# Patient Record
Sex: Female | Born: 1980 | Race: Black or African American | Hispanic: No | Marital: Married | Smoking: Former smoker
Health system: Southern US, Community
[De-identification: ages and names within clinical notes are randomized; demographics above are authoritative.]

## PROBLEM LIST (undated history)

## (undated) DIAGNOSIS — IMO0002 Reserved for concepts with insufficient information to code with codable children: Secondary | ICD-10-CM

## (undated) DIAGNOSIS — T7840XA Allergy, unspecified, initial encounter: Secondary | ICD-10-CM

## (undated) DIAGNOSIS — R87619 Unspecified abnormal cytological findings in specimens from cervix uteri: Secondary | ICD-10-CM

## (undated) DIAGNOSIS — N390 Urinary tract infection, site not specified: Secondary | ICD-10-CM

## (undated) HISTORY — PX: KNEE ARTHROSCOPY: SHX127

## (undated) HISTORY — PX: CHOLECYSTECTOMY: SHX55

## (undated) HISTORY — PX: TUBAL LIGATION: SHX77

## (undated) HISTORY — DX: Allergy, unspecified, initial encounter: T78.40XA

---

## 2011-11-18 ENCOUNTER — Emergency Department (HOSPITAL_COMMUNITY)
Admission: EM | Admit: 2011-11-18 | Discharge: 2011-11-18 | Disposition: A | Discharge status: Home / Self Care | Payer: Self-pay | Attending: Emergency Medicine | Admitting: Emergency Medicine

## 2011-11-18 ENCOUNTER — Encounter (HOSPITAL_COMMUNITY): Payer: Self-pay | Admitting: Emergency Medicine

## 2011-11-18 DIAGNOSIS — G44209 Tension-type headache, unspecified, not intractable: Secondary | ICD-10-CM | POA: Insufficient documentation

## 2011-11-18 DIAGNOSIS — Z886 Allergy status to analgesic agent status: Secondary | ICD-10-CM | POA: Insufficient documentation

## 2011-11-18 MED ORDER — TIZANIDINE HCL 4 MG PO TABS: 4.0000 mg | ORAL_TABLET | Freq: Three times a day (TID) | ORAL | Status: AC | PRN

## 2011-11-18 MED ORDER — BUTALBITAL-APAP-CAFFEINE 50-325-40 MG PO TABS
2.0000 | ORAL_TABLET | Freq: Once | ORAL | Status: AC
  Administered 2011-11-18: 2 via ORAL
  Filled 2011-11-18: qty 2

## 2011-11-18 MED ORDER — BUTALBITAL-APAP-CAFFEINE 50-325-40 MG PO TABS: 1.0000 | ORAL_TABLET | ORAL | Status: AC | PRN

## 2011-11-18 NOTE — ED Notes (Signed)
Onset of headache yesterday afternoon, has Rx'ed w/ multiple OTCs w/o relief. Frontal/temporal areas.

## 2011-11-18 NOTE — ED Provider Notes (Signed)
History     CSN: [id number]  Arrival date & time 11/18/11  1207   First MD Initiated Contact with Patient 11/18/11 1249      Chief Complaint  Patient presents with  . Headache    (Consider location/radiation/quality/duration/timing/severity/associated sxs/prior treatment) Patient is a 31 y.o. female presenting with headaches. The history is provided by the patient.  Headache  This is a new problem. The current episode started yesterday. The problem occurs constantly. Progression since onset: gradually worsened at onset, but has stabilized at moderate intensity. The headache is associated with nothing. Pain location: like a band over frontal and temporal regions. Quality: aching. The pain is moderate. The pain does not radiate. Associated symptoms include nausea. Pertinent negatives include no anorexia, no fever, no malaise/fatigue, no near-syncope, no palpitations, no syncope, no shortness of breath and no vomiting. She has tried acetaminophen for the symptoms. The treatment provided no relief.    History reviewed. No pertinent past medical history.  Past Surgical History  Procedure Date  . Cholecystectomy   . Tubal ligation     No family history on file.  History  Substance Use Topics  . Smoking status: Former Games developer  . Smokeless tobacco: Never Used  . Alcohol Use:      socially     Review of Systems  Constitutional: Negative for fever and malaise/fatigue.  Respiratory: Negative for shortness of breath.   Cardiovascular: Negative for palpitations, syncope and near-syncope.  Gastrointestinal: Positive for nausea. Negative for vomiting and anorexia.  Neurological: Positive for headaches.  All other systems reviewed and are negative.    Allergies  Aspirin and Other  Home Medications   Current Outpatient Rx  Name Route Sig Dispense Refill  . ACETAMINOPHEN 325 MG PO TABS Oral Take 650 mg by mouth every 6 (six) hours as needed. For headache    . TRAMADOL HCL 50 MG  PO TABS Oral Take 50 mg by mouth See admin instructions. A friends medication      BP 105/58  Pulse 72  Temp 98.5 F (36.9 C) (Oral)  SpO2 99%  LMP 11/06/2011  Physical Exam  Constitutional: She is oriented to person, place, and time. She appears well-developed and well-nourished. No distress.       Vital signs are reviewed and are normal. She is afebrile.  HENT:  Head: Normocephalic and atraumatic.  Right Ear: External ear normal.  Left Ear: External ear normal.  Nose: Nose normal.  Mouth/Throat: No oropharyngeal exudate.       Enlarged tonsils without exudate bilaterally, chronic per pt  Eyes: Conjunctivae and EOM are normal. Pupils are equal, round, and reactive to light.       No nystagmus  Neck: Normal range of motion. Neck supple.  Cardiovascular: Normal rate, regular rhythm and normal heart sounds.   Pulmonary/Chest: Effort normal and breath sounds normal. No respiratory distress.  Abdominal: Soft. Bowel sounds are normal. She exhibits no distension. There is no tenderness.  Lymphadenopathy:    She has no cervical adenopathy.  Neurological: She is alert and oriented to person, place, and time. She has normal strength. No cranial nerve deficit (3-12 intact) or sensory deficit. She displays a negative Romberg sign. Coordination (F-N intact) and gait normal. GCS eye subscore is 4. GCS verbal subscore is 5. GCS motor subscore is 6.  Skin: Skin is warm and dry. No rash noted.  Psychiatric: She has a normal mood and affect.    ED Course  Procedures (including critical care time)  Labs Reviewed - No data to display No results found.   1. Tension headache       MDM  HA x 1 day. Similar prior HA usually improve with acetaminophen. Allergic to NSAIDs. Suspect primary HA, tension in nature. History and PE not c/w SDH, SAH/other ICH, meningitis, temporal arteritis, encephalitis. Given fioricet in ED with some relief. Pt felt ready for d/c home but did request further  medication for home use. Discussed tizanidine vs fioricet. She is given rx for both and will take as needed. Currently attempting to obtain a PCP for follow-up. Return precautions discussed.         Shaaron Adler, [place] 11/18/11 1503

## 2011-11-19 ENCOUNTER — Emergency Department (HOSPITAL_COMMUNITY)
Admission: EM | Admit: 2011-11-19 | Discharge: 2011-11-19 | Disposition: A | Discharge status: Home / Self Care | Payer: Self-pay | Attending: Emergency Medicine | Admitting: Emergency Medicine

## 2011-11-19 ENCOUNTER — Encounter (HOSPITAL_COMMUNITY): Payer: Self-pay | Admitting: *Deleted

## 2011-11-19 DIAGNOSIS — L0231 Cutaneous abscess of buttock: Secondary | ICD-10-CM | POA: Insufficient documentation

## 2011-11-19 DIAGNOSIS — Z87891 Personal history of nicotine dependence: Secondary | ICD-10-CM | POA: Insufficient documentation

## 2011-11-19 DIAGNOSIS — L0291 Cutaneous abscess, unspecified: Secondary | ICD-10-CM

## 2011-11-19 NOTE — ED Notes (Signed)
Patient is alert and oriented x3.  She is complaining of pain from an inflamed area  On the lateral side of her left buttock.  She states her pain a 3 of 10.  She states that the  That she has drainage coming from her left buttock.

## 2011-11-19 NOTE — ED Provider Notes (Signed)
Medical screening examination/treatment/procedure(s) were performed by non-physician practitioner and as supervising physician I was immediately available for consultation/collaboration.   Lizzette Carbonell L Reeanna Acri, MD 11/19/11 1514 

## 2011-11-19 NOTE — ED Provider Notes (Signed)
History     CSN: [id number]  Arrival date & time 11/19/11  1441   First MD Initiated Contact with Patient 11/19/11 1539      Chief Complaint  Patient presents with  . Recurrent Skin Infections    (Consider location/radiation/quality/duration/timing/severity/associated sxs/prior treatment) HPI This generally well young female with concerns over lesion.  She notes that the lesion began several days ago, insidiously, since onset has become more uncomfortable, now with spontaneous drainage of pus.  She denies any concurrent fevers, chills, nausea, vomiting.  She presented here yesterday for a headache.  She notes that that is entirely resolved and is not a current complaints. History reviewed. No pertinent past medical history.  Past Surgical History  Procedure Date  . Cholecystectomy   . Tubal ligation     History reviewed. No pertinent family history.  History  Substance Use Topics  . Smoking status: Former Games developer  . Smokeless tobacco: Never Used  . Alcohol Use:      socially    OB History    Grav Para Term Preterm Abortions TAB SAB Ect Mult Living                  Review of Systems  All other systems reviewed and are negative.    Allergies  Aspirin and Other  Home Medications   Current Outpatient Rx  Name Route Sig Dispense Refill  . ACETAMINOPHEN 325 MG PO TABS Oral Take 650 mg by mouth every 6 (six) hours as needed. For headache    . BUTALBITAL-APAP-CAFFEINE 50-325-40 MG PO TABS Oral Take 1 tablet by mouth every 4 (four) hours as needed for headache. 10 tablet 0  . TIZANIDINE HCL 4 MG PO TABS Oral Take 1 tablet (4 mg total) by mouth every 8 (eight) hours as needed. 15 tablet 0    BP 106/63  Pulse 80  Temp 97.8 F (36.6 C) (Oral)  Resp 18  Ht 5\' 7"  (1.702 m)  Wt 200 lb (90.719 kg)  BMI 31.32 kg/m2  SpO2 99%  LMP 11/06/2011  Physical Exam  Nursing note and vitals reviewed. Constitutional: She is oriented to person, place, and time. She appears  well-developed and well-nourished. No distress.  HENT:  Head: Normocephalic and atraumatic.  Eyes: Conjunctivae and EOM are normal.  Pulmonary/Chest: Effort normal. No stridor. No respiratory distress.  Musculoskeletal:       On the left medial buttock there is a punctate approximately 1 cm area that is actively draining pus, with surrounding induration approximately 5 cm in diameter.  No surrounding erythema  Neurological: She is alert and oriented to person, place, and time. She exhibits normal muscle tone.       Is appropriate, the patient has no focal neuro deficits  Skin: Skin is warm and dry.  Psychiatric: She has a normal mood and affect.    ED Course  Procedures (including critical care time)  Labs Reviewed - No data to display No results found.   1. Abscess       MDM  This generally well female presents with new left buttock lesion.  On exam the patient is in no distress.  There is a actively draining lesion on her left buttock.  We discussed the options of either conservative therapy with warm compresses, sitz baths and gentle pressure versus incision and drainage.  Given the actively draining lesion, and the additional cost of incision and drainage the patient notes a preference for continued conservative therapy.  Absent concerning for  systemic infection, and without any other current complaints, the patient is appropriate for this management.  She was discharged in stable condition after discussion on wound care    Gerhard Munch, MD 11/19/11 [phone]

## 2012-06-18 ENCOUNTER — Encounter (HOSPITAL_COMMUNITY): Payer: Self-pay | Admitting: *Deleted

## 2012-06-18 ENCOUNTER — Inpatient Hospital Stay (HOSPITAL_COMMUNITY)
Admission: AD | Admit: 2012-06-18 | Discharge: 2012-06-18 | Disposition: A | Discharge status: Home / Self Care | Payer: 59 | Source: Ambulatory Visit | Attending: Obstetrics & Gynecology | Admitting: Obstetrics & Gynecology

## 2012-06-18 ENCOUNTER — Inpatient Hospital Stay (HOSPITAL_COMMUNITY): Payer: 59

## 2012-06-18 DIAGNOSIS — A499 Bacterial infection, unspecified: Secondary | ICD-10-CM | POA: Insufficient documentation

## 2012-06-18 DIAGNOSIS — N949 Unspecified condition associated with female genital organs and menstrual cycle: Secondary | ICD-10-CM | POA: Insufficient documentation

## 2012-06-18 DIAGNOSIS — B9689 Other specified bacterial agents as the cause of diseases classified elsewhere: Secondary | ICD-10-CM

## 2012-06-18 DIAGNOSIS — N760 Acute vaginitis: Secondary | ICD-10-CM

## 2012-06-18 DIAGNOSIS — M25559 Pain in unspecified hip: Secondary | ICD-10-CM

## 2012-06-18 DIAGNOSIS — R109 Unspecified abdominal pain: Secondary | ICD-10-CM | POA: Insufficient documentation

## 2012-06-18 HISTORY — DX: Unspecified abnormal cytological findings in specimens from cervix uteri: R87.619

## 2012-06-18 HISTORY — DX: Urinary tract infection, site not specified: N39.0

## 2012-06-18 HISTORY — DX: Reserved for concepts with insufficient information to code with codable children: IMO0002

## 2012-06-18 LAB — CBC WITH DIFFERENTIAL/PLATELET
Basophils Absolute: 0 10*3/uL (ref 0.0–0.1)
HCT: 36 % (ref 36.0–46.0)
Hemoglobin: 12.4 g/dL (ref 12.0–15.0)
Lymphocytes Relative: 29 % (ref 12–46)
Monocytes Absolute: 0.7 10*3/uL (ref 0.1–1.0)
Neutro Abs: 4.3 10*3/uL (ref 1.7–7.7)
Neutrophils Relative %: 58 % (ref 43–77)
RDW: 13.5 % (ref 11.5–15.5)
WBC: 7.4 10*3/uL (ref 4.0–10.5)

## 2012-06-18 LAB — URINALYSIS, ROUTINE W REFLEX MICROSCOPIC
Glucose, UA: NEGATIVE mg/dL
Leukocytes, UA: NEGATIVE
Protein, ur: NEGATIVE mg/dL
pH: 7.5 (ref 5.0–8.0)

## 2012-06-18 LAB — POCT PREGNANCY, URINE: Preg Test, Ur: NEGATIVE

## 2012-06-18 LAB — WET PREP, GENITAL
Trich, Wet Prep: NONE SEEN
Yeast Wet Prep HPF POC: NONE SEEN

## 2012-06-18 MED ORDER — METRONIDAZOLE 500 MG PO TABS: 500.0000 mg | ORAL_TABLET | Freq: Two times a day (BID) | ORAL | Status: DC

## 2012-06-18 MED ORDER — HYDROCODONE-ACETAMINOPHEN 5-325 MG PO TABS: 1.0000 | ORAL_TABLET | ORAL | Status: DC | PRN

## 2012-06-18 MED ORDER — HYDROCODONE-ACETAMINOPHEN 5-325 MG PO TABS
1.0000 | ORAL_TABLET | Freq: Once | ORAL | Status: AC
  Administered 2012-06-18: 1 via ORAL
  Filled 2012-06-18: qty 1

## 2012-06-18 NOTE — MAU Provider Note (Signed)
History     CSN: [id number]  Arrival date and time: 06/18/12 1412   First Provider Initiated Contact with Patient 06/18/12 1449      Chief Complaint  Patient presents with  . Abdominal Pain   HPI Rebecca Figueroa is a 32 y.o. female who presents to MAU with pelvic pain. This is a new problem. The pain started a few days ago. The pain started gradual and today has been a lot worse. She describes the pain as constant aching. She rates the pain as 6/10. Associated symptoms include vaginal discharge. She denies fever, chill, nausea or vomiting. BTL for birth control. Last pap smear 3 years and was normal. Current sex partner x 10 years. No history of STI's. The history was provided by the patient.  OB History   Grav Para Term Preterm Abortions TAB SAB Ect Mult Living   4 4 4  0 0 0 0 0 0 4      Past Medical History  Diagnosis Date  . Urinary tract infection   . Abnormal Pap smear     colpo    Past Surgical History  Procedure Laterality Date  . Cholecystectomy    . Tubal ligation      Family History  Problem Relation Age of Onset  . Cancer Father     cancer  . Hypertension Maternal Aunt   . Kidney disease Paternal Aunt     2 aunts- died from kidney failure  . Cancer Maternal Grandfather     stomach    History  Substance Use Topics  . Smoking status: Current Some Day Smoker  . Smokeless tobacco: Never Used  . Alcohol Use: No     Comment: socially    Allergies:  Allergies  Allergen Reactions  . Aspirin Anaphylaxis  . Other Hives    Food=shellfish    Prescriptions prior to admission  Medication Sig Dispense Refill  . acetaminophen (TYLENOL) 325 MG tablet Take 650 mg by mouth every 6 (six) hours as needed. For headache        Review of Systems  Constitutional: Negative for fever, chills and weight loss.  HENT: Negative for ear pain, nosebleeds, congestion, sore throat and neck pain.   Eyes: Negative for blurred vision, double vision, photophobia and  pain.  Respiratory: Negative for cough, shortness of breath and wheezing.   Cardiovascular: Negative for chest pain, palpitations and leg swelling.  Gastrointestinal: Positive for abdominal pain. Negative for heartburn, nausea, vomiting, diarrhea and constipation.  Genitourinary: Negative for dysuria, urgency and frequency.  Musculoskeletal: Negative for myalgias and back pain.  Skin: Negative for itching and rash.  Neurological: Negative for dizziness, sensory change, speech change, seizures, weakness and headaches.  Endo/Heme/Allergies: Does not bruise/bleed easily.  Psychiatric/Behavioral: Negative for depression and substance abuse. The patient is not nervous/anxious and does not have insomnia.    Blood pressure 110/62, pulse 82, temperature 98.5 F (36.9 C), temperature source Oral, resp. rate 18, height 5\' 7"  (1.702 m), weight 204 lb 6.4 oz (92.715 kg), last menstrual period 05/27/2012.  Physical Exam  Nursing note and vitals reviewed. Constitutional: She is oriented to person, place, and time. She appears well-developed and well-nourished. No distress.  HENT:  Head: Normocephalic and atraumatic.  Eyes: EOM are normal.  Neck: Neck supple.  Cardiovascular: Normal rate.   Respiratory: Effort normal.  GI: Soft. There is tenderness.  Mildly tender lower abdomen without guarding or rebound.  Genitourinary:  External genitalia without lesions. Frothy malodorous discharge vaginal vault. Cervix inflamed,  positive CMT, bilateral adnexal tenderness. Uterus without palpable enlargement.  Musculoskeletal: Normal range of motion.  Neurological: She is alert and oriented to person, place, and time.  Skin: Skin is warm and dry.  Psychiatric: She has a normal mood and affect. Her behavior is normal. Judgment and thought content normal.   Procedures  Results for orders placed during the hospital encounter of 06/18/12 (from the past 24 hour(s))  URINALYSIS, ROUTINE W REFLEX MICROSCOPIC      Status: Abnormal   Collection Time    06/18/12  2:25 PM      Result Value Range   Color, Urine YELLOW  YELLOW   APPearance CLOUDY (*) CLEAR   Specific Gravity, Urine 1.010  1.005 - 1.030   pH 7.5  5.0 - 8.0   Glucose, UA NEGATIVE  NEGATIVE mg/dL   Hgb urine dipstick SMALL (*) NEGATIVE   Bilirubin Urine NEGATIVE  NEGATIVE   Ketones, ur NEGATIVE  NEGATIVE mg/dL   Protein, ur NEGATIVE  NEGATIVE mg/dL   Urobilinogen, UA 1.0  0.0 - 1.0 mg/dL   Nitrite NEGATIVE  NEGATIVE   Leukocytes, UA NEGATIVE  NEGATIVE  URINE MICROSCOPIC-ADD ON     Status: Abnormal   Collection Time    06/18/12  2:25 PM      Result Value Range   Squamous Epithelial / LPF FEW (*) RARE   WBC, UA 0-2  <3 WBC/hpf   Urine-Other AMORPHOUS URATES/PHOSPHATES    POCT PREGNANCY, URINE     Status: None   Collection Time    06/18/12  2:48 PM      Result Value Range   Preg Test, Ur NEGATIVE  NEGATIVE  CBC WITH DIFFERENTIAL     Status: None   Collection Time    06/18/12  3:03 PM      Result Value Range   WBC 7.4  4.0 - 10.5 K/uL   RBC 4.12  3.87 - 5.11 MIL/uL   Hemoglobin 12.4  12.0 - 15.0 g/dL   HCT 16.1  09.6 - 04.5 %   MCV 87.4  78.0 - 100.0 fL   MCH 30.1  26.0 - 34.0 pg   MCHC 34.4  30.0 - 36.0 g/dL   RDW 40.9  81.1 - 91.4 %   Platelets 307  150 - 400 K/uL   Neutrophils Relative 58  43 - 77 %   Neutro Abs 4.3  1.7 - 7.7 K/uL   Lymphocytes Relative 29  12 - 46 %   Lymphs Abs 2.2  0.7 - 4.0 K/uL   Monocytes Relative 9  3 - 12 %   Monocytes Absolute 0.7  0.1 - 1.0 K/uL   Eosinophils Relative 3  0 - 5 %   Eosinophils Absolute 0.2  0.0 - 0.7 K/uL   Basophils Relative 1  0 - 1 %   Basophils Absolute 0.0  0.0 - 0.1 K/uL  WET PREP, GENITAL     Status: Abnormal   Collection Time    06/18/12  3:30 PM      Result Value Range   Yeast Wet Prep HPF POC NONE SEEN  NONE SEEN   Trich, Wet Prep NONE SEEN  NONE SEEN   Clue Cells Wet Prep HPF POC FEW (*) NONE SEEN   WBC, Wet Prep HPF POC FEW (*) NONE SEEN    US  Transvaginal Non-ob  06/18/2012  *RADIOLOGY REPORT*  Clinical Data: Pelvic pain.  LMP 05/27/2012.  TRANSABDOMINAL AND TRANSVAGINAL ULTRASOUND OF PELVIS  Technique:  Both transabdominal and transvaginal ultrasound examinations of the pelvis were performed.  Transabdominal technique was performed for global imaging of the pelvis including uterus, ovaries, adnexal regions, and pelvic cul-de-sac.  It was necessary to proceed with endovaginal exam following the transabdominal exam to visualize the endometrium and ovaries.  Comparison:  None.  Findings: Uterus:  9.2 x 4.8 x 5.3 cm.  No fibroids or other uterine mass identified.  Endometrium: Double layer thickness measures 7 mm transvaginally. No focal lesion visualized.  Right ovary: 1.9 x 1.5 x 1.6 cm. Normal appearance.  No adnexal mass identified.  Left ovary: 2.9 x 1.9 x 1.6 cm.  Normal appearance.  No adnexal mass identified.  Other Findings:  No free fluid  IMPRESSION: Negative.  No evidence of pelvic mass or other significant abnormality.   Original Report Authenticated By: Myles Rosenthal, M.D.    US Pelvis Complete  06/18/2012  *RADIOLOGY REPORT*  Clinical Data: Pelvic pain.  LMP 05/27/2012.  TRANSABDOMINAL AND TRANSVAGINAL ULTRASOUND OF PELVIS  Technique:  Both transabdominal and transvaginal ultrasound examinations of the pelvis were performed.  Transabdominal technique was performed for global imaging of the pelvis including uterus, ovaries, adnexal regions, and pelvic cul-de-sac.  It was necessary to proceed with endovaginal exam following the transabdominal exam to visualize the endometrium and ovaries.  Comparison:  None.  Findings: Uterus:  9.2 x 4.8 x 5.3 cm.  No fibroids or other uterine mass identified.  Endometrium: Double layer thickness measures 7 mm transvaginally. No focal lesion visualized.  Right ovary: 1.9 x 1.5 x 1.6 cm. Normal appearance.  No adnexal mass identified.  Left ovary: 2.9 x 1.9 x 1.6 cm.  Normal appearance.  No adnexal mass  identified.  Other Findings:  No free fluid  IMPRESSION: Negative.  No evidence of pelvic mass or other significant abnormality.   Original Report Authenticated By: Myles Rosenthal, M.D.     Assessment: 32 y.o. female with pelvic pain   Bacterial vaginosis  Plan:  GC, Chlamydia cultures pending   Treat for BV   Return as needed Discussed with the patient and all questioned fully answered. She will return if any problems arise.    Medication List    TAKE these medications       HYDROcodone-acetaminophen 5-325 MG per tablet  Commonly known as:  NORCO/VICODIN  Take 1 tablet by mouth every 4 (four) hours as needed for pain.     metroNIDAZOLE 500 MG tablet  Commonly known as:  FLAGYL  Take 1 tablet (500 mg total) by mouth 2 (two) times daily.      ASK your doctor about these medications       acetaminophen 325 MG tablet  Commonly known as:  TYLENOL  Take 650 mg by mouth every 6 (six) hours as needed. For headache         NEESE,HOPE, RN, FNP, [facility] 06/18/2012, 2:50 PM

## 2012-06-18 NOTE — MAU Note (Signed)
Started kind of crampy in lower pelvis a few days ago, now more constant pressure and discomfort.

## 2012-06-18 NOTE — MAU Provider Note (Signed)
Attestation of Attending Supervision of Advanced Practitioner (PA/CNM/NP): Evaluation and management procedures were performed by the Advanced Practitioner under my supervision and collaboration.  I have reviewed the Advanced Practitioner's note and chart, and I agree with the management and plan.  Tineshia Becraft, MD, FACOG Attending Obstetrician & Gynecologist Faculty Practice, [facility]  

## 2012-06-19 LAB — GC/CHLAMYDIA PROBE AMP: GC Probe RNA: NEGATIVE

## 2013-02-12 ENCOUNTER — Other Ambulatory Visit: Payer: Self-pay | Admitting: Obstetrics and Gynecology

## 2013-02-12 ENCOUNTER — Other Ambulatory Visit (HOSPITAL_COMMUNITY)
Admission: RE | Admit: 2013-02-12 | Discharge: 2013-02-12 | Disposition: A | Discharge status: Home / Self Care | Payer: 59 | Source: Ambulatory Visit | Attending: Obstetrics and Gynecology | Admitting: Obstetrics and Gynecology

## 2013-02-12 DIAGNOSIS — Z01419 Encounter for gynecological examination (general) (routine) without abnormal findings: Secondary | ICD-10-CM | POA: Insufficient documentation

## 2013-02-12 DIAGNOSIS — Z1151 Encounter for screening for human papillomavirus (HPV): Secondary | ICD-10-CM | POA: Insufficient documentation

## 2013-02-20 ENCOUNTER — Emergency Department (HOSPITAL_COMMUNITY)
Admission: EM | Admit: 2013-02-20 | Discharge: 2013-02-20 | Disposition: A | Discharge status: Home / Self Care | Payer: 59 | Attending: Emergency Medicine | Admitting: Emergency Medicine

## 2013-02-20 ENCOUNTER — Emergency Department (HOSPITAL_COMMUNITY): Payer: 59

## 2013-02-20 ENCOUNTER — Encounter (HOSPITAL_COMMUNITY): Payer: Self-pay | Admitting: Emergency Medicine

## 2013-02-20 DIAGNOSIS — R0789 Other chest pain: Secondary | ICD-10-CM | POA: Insufficient documentation

## 2013-02-20 DIAGNOSIS — M79609 Pain in unspecified limb: Secondary | ICD-10-CM | POA: Insufficient documentation

## 2013-02-20 DIAGNOSIS — F17200 Nicotine dependence, unspecified, uncomplicated: Secondary | ICD-10-CM | POA: Insufficient documentation

## 2013-02-20 MED ORDER — TRAMADOL HCL 50 MG PO TABS: 50.0000 mg | ORAL_TABLET | Freq: Four times a day (QID) | ORAL | Status: DC | PRN

## 2013-02-20 MED ORDER — ACETAMINOPHEN 325 MG PO TABS
650.0000 mg | ORAL_TABLET | Freq: Once | ORAL | Status: AC
  Administered 2013-02-20: 650 mg via ORAL
  Filled 2013-02-20: qty 2

## 2013-02-20 NOTE — ED Notes (Signed)
Pt states that for 2 days with movement she has pain to lt upper chest area to shoulder states pain is with movement.

## 2013-02-20 NOTE — ED Provider Notes (Signed)
CSN: [id number]     Arrival date & time 02/20/13  0911 History   First MD Initiated Contact with Patient 02/20/13 0919     Chief Complaint  Patient presents with  . Arm Pain  . Chest Pain   (Consider location/radiation/quality/duration/timing/severity/associated sxs/prior Treatment) Patient is a 32 y.o. female presenting with arm pain and chest pain. The history is provided by the patient.  Arm Pain Associated symptoms include chest pain. Pertinent negatives include no abdominal pain, no headaches and no shortness of breath.  Chest Pain Associated symptoms: no abdominal pain, no back pain, no fever, no headache and no shortness of breath   pt c/o midline/central cp for the past day/24 hrs, constant, dull, moderate, worse w palpation, turning steering wheel, turning torso. No associated sob, nv or diaphoresis. No unusual fatigue or doe. No pleuritic pain, no lateralizing cp.  Pt denies cough or uri c/o. No recent viral or febrile illness. No change in pain whether upright or supine. No leg pain or swelling. No immobility/travel/surgery. No hx dvt or pe. No drug/cocaine use. Denies personal or family hx heart disease, no premature cad. 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 No hx gerd, denies current heartburn or other gi symptoms.     Past Medical History  Diagnosis Date  . Urinary tract infection   . Abnormal Pap smear     colpo   Past Surgical History  Procedure Laterality Date  . Cholecystectomy    . Tubal ligation     Family History  Problem Relation Age of Onset  . Cancer Father     cancer  . Hypertension Maternal Aunt   . Kidney disease Paternal Aunt     2 aunts- died from kidney failure  . Cancer Maternal Grandfather     stomach   History  Substance Use Topics  . Smoking status: Current Some Day Smoker  . Smokeless tobacco: Never Used  . Alcohol Use: No     Comment: socially    OB History   Grav Para Term Preterm Abortions TAB SAB Ect Mult Living   4 4 4  0 0 0 0 0 0 4     Review of Systems  Constitutional: Negative for fever and chills.  Eyes: Negative for redness.  Respiratory: Negative for shortness of breath.   Cardiovascular: Positive for chest pain.  Gastrointestinal: Negative for abdominal pain.  Genitourinary: Negative for flank pain.  Musculoskeletal: Negative for back pain and neck pain.  Skin: Negative for rash.  Neurological: Negative for headaches.  Hematological: Does not bruise/bleed easily.  Psychiatric/Behavioral: Negative for confusion.    Allergies  Aspirin; Ibuprofen; and Other  Home Medications   Current Outpatient Rx  Name  Route  Sig  Dispense  Refill  . acetaminophen (TYLENOL) 325 MG tablet   Oral   Take 650 mg by mouth every 6 (six) hours as needed. For headache         . HYDROcodone-acetaminophen (NORCO/VICODIN) 5-325 MG per tablet   Oral   Take 1 tablet by mouth every 4 (four) hours as needed for pain.   10 tablet   0   . metroNIDAZOLE (FLAGYL) 500 MG tablet   Oral   Take 1 tablet (500 mg total) by mouth 2 (two) times daily.   14 tablet   0    BP 106/58  Pulse 66  Temp(Src) 97.9 F (36.6 C) (Oral)  Resp 16  SpO2 99% Physical Exam  Nursing note and vitals reviewed. Constitutional: She appears well-developed and well-nourished. No distress.  HENT:  Mouth/Throat: Oropharynx is clear and moist.  Eyes: Conjunctivae are normal. No scleral icterus.  Neck: Neck supple. No tracheal deviation present.  Cardiovascular: Normal rate, regular rhythm, normal heart sounds and intact distal pulses.  Exam reveals no gallop and no friction rub.   No murmur heard. Pulmonary/Chest: Effort normal and breath sounds normal. No respiratory distress. She exhibits tenderness.  +chest wall tenderness reproducing symptoms.   Abdominal: Normal appearance. She exhibits no distension. There is no tenderness.  Musculoskeletal: She  exhibits no edema and no tenderness.  Neurological: She is alert.  Skin: Skin is warm and dry. No rash noted.  Psychiatric: She has a normal mood and affect.    ED Course  Procedures (including critical care time)  Dg Chest 2 View  02/20/2013   CLINICAL DATA:  Chest pain  EXAM: CHEST  2 VIEW  COMPARISON:  None.  FINDINGS: The lungs are clear. Heart size and pulmonary vascularity are normal. No adenopathy. No pneumothorax. No bone lesions.  IMPRESSION: No abnormality noted.   Electronically Signed   By: Bretta Bang M.D.   On: 02/20/2013 09:56     EKG Interpretation     Ventricular Rate:  66 PR Interval:  159 QRS Duration: 88 QT Interval:  387 QTC Calculation: 406 R Axis:   74 Text Interpretation:  Sinus rhythm No previous tracing            MDM  Pt drove self to ED.  No meds pta. Tylenol po.  Pt notes allergy to nsaid, incl sob, throat swelling, therefore will use alternative pain rx for home, rx ultram given.  Pt w reproducible chest wall pain, tenderness, also reproduced w certain movement, turning torso, turning steering wheel, all felt most c/w musculoskeletal/chest wall pain.  Pt appears stable for d/c.       Suzi Roots, MD 02/20/13 [phone]

## 2013-02-20 NOTE — ED Notes (Signed)
Patient transported to X-ray 

## 2014-03-09 ENCOUNTER — Encounter (HOSPITAL_COMMUNITY): Payer: Self-pay | Admitting: Emergency Medicine

## 2014-08-18 IMAGING — US US PELVIS COMPLETE
1 series · 14 of 25 positions shown · non-contrast
Comparison: None.

CLINICAL DATA: Pelvic pain.  LMP 05/27/2012.



[Series 1: us pelvis complete · 14 of 48 slices shown]
[im 1/48]
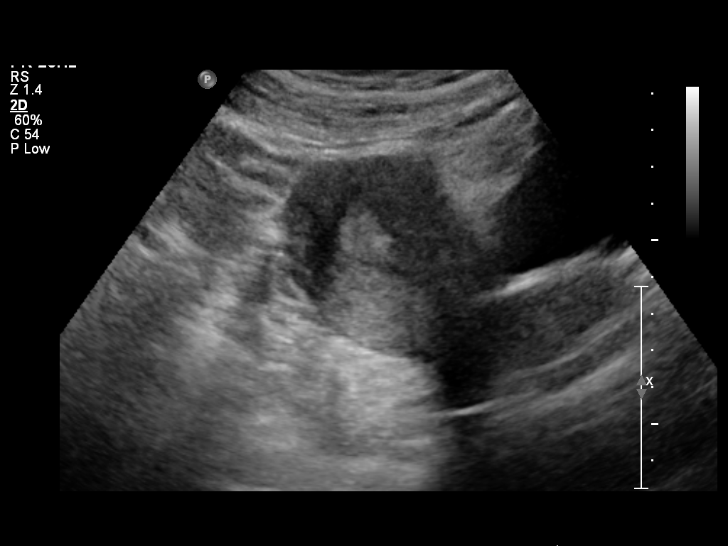
[im 4/48]
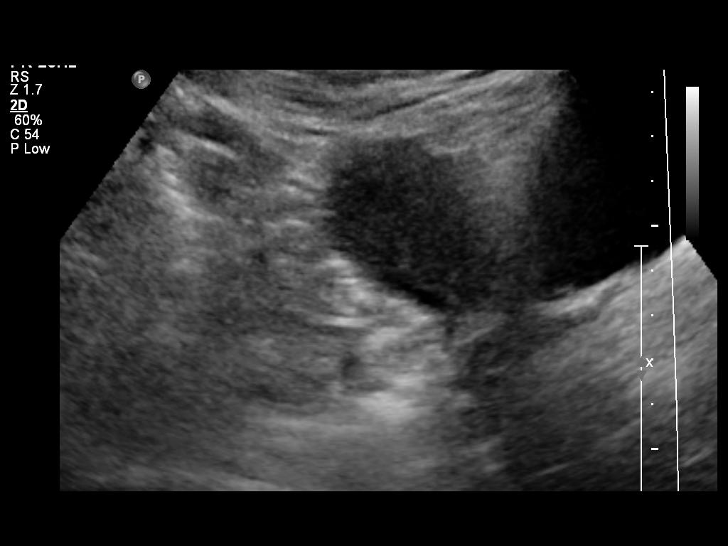
[im 8/48]
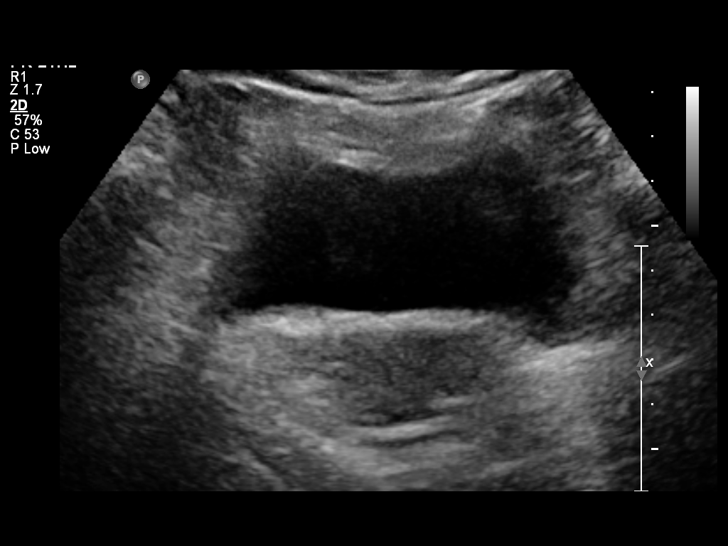
[im 12/48]
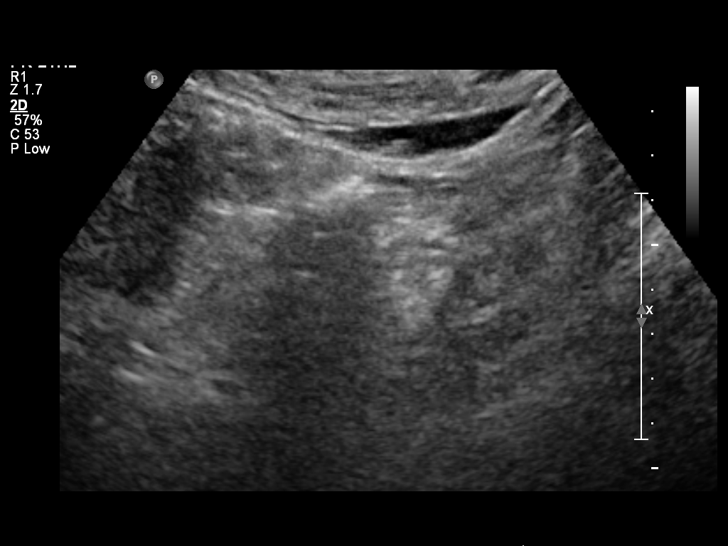
[im 16/48]
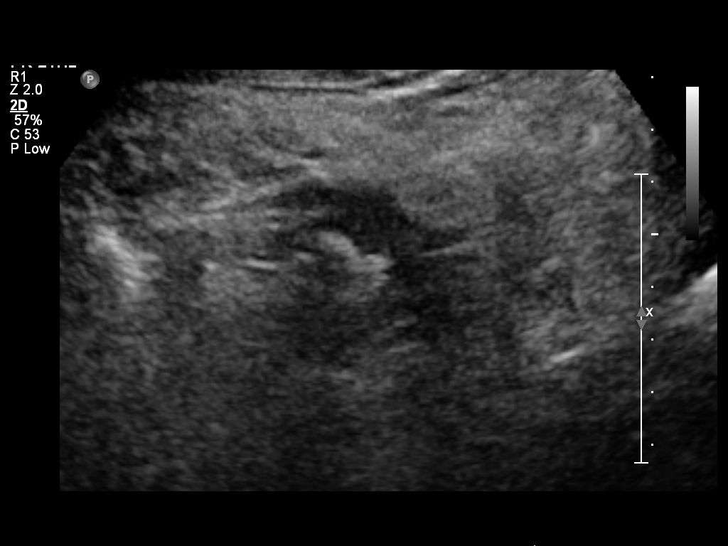
[im 18/48]
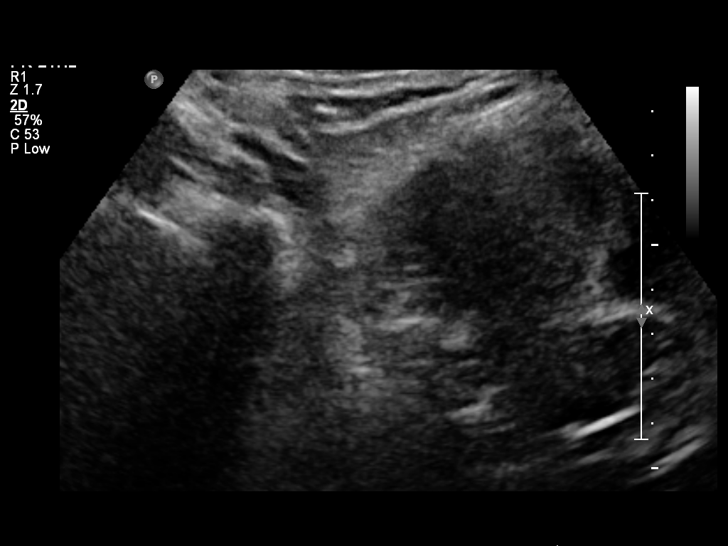
[im 22/48]
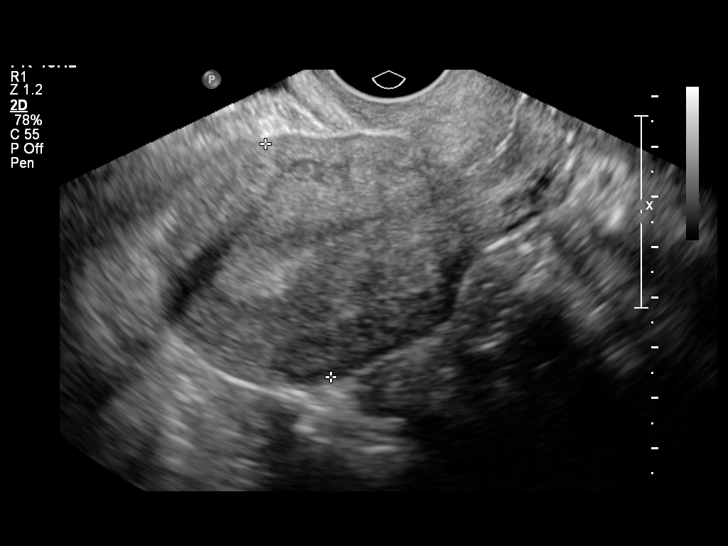
[im 26/48]
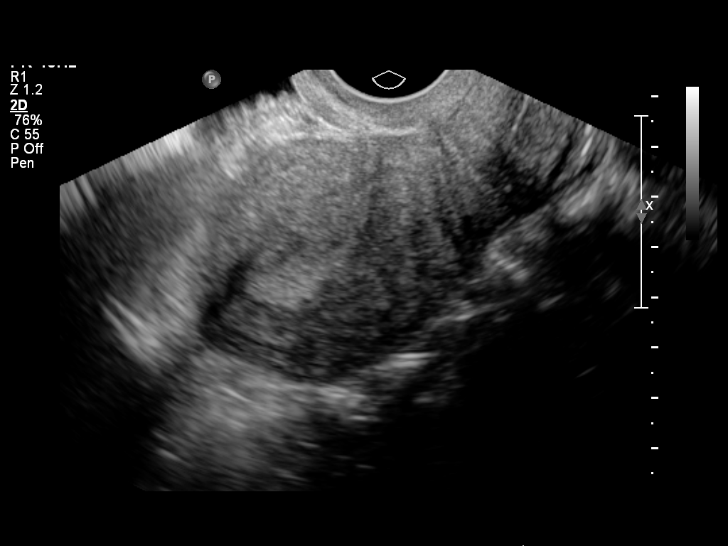
[im 30/48]
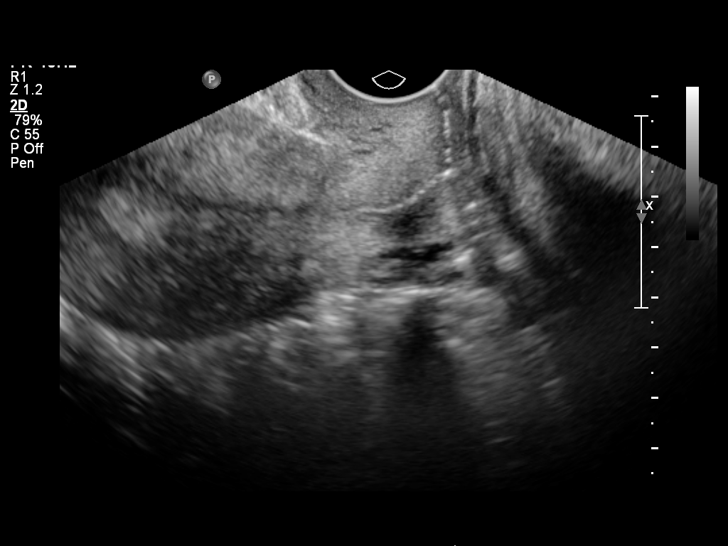
[im 32/48]
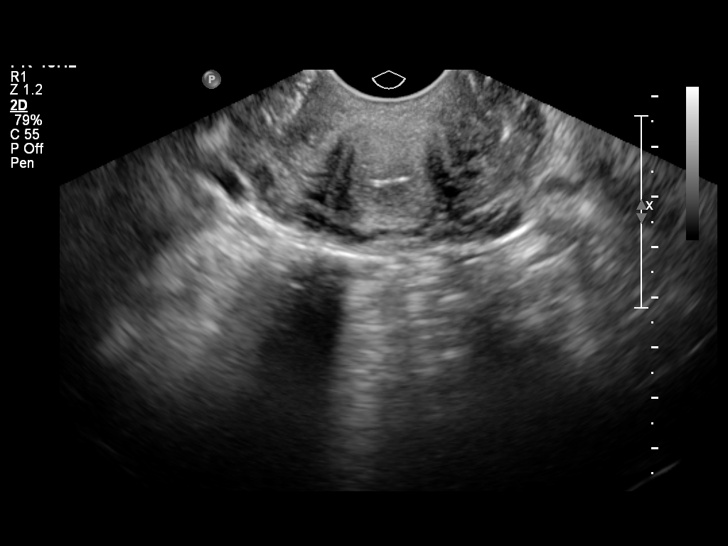
[im 36/48]
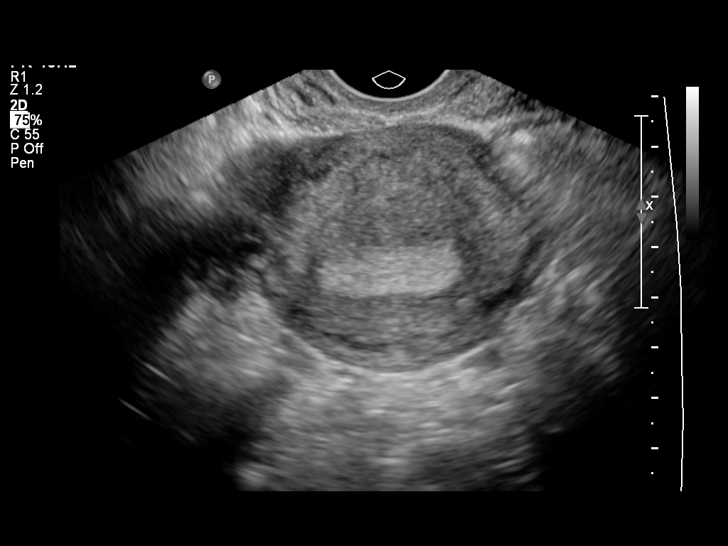
[im 40/48]
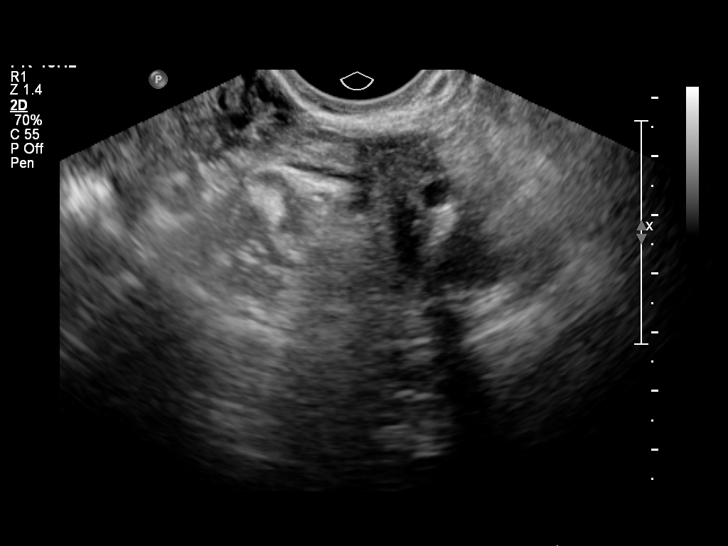
[im 44/48]
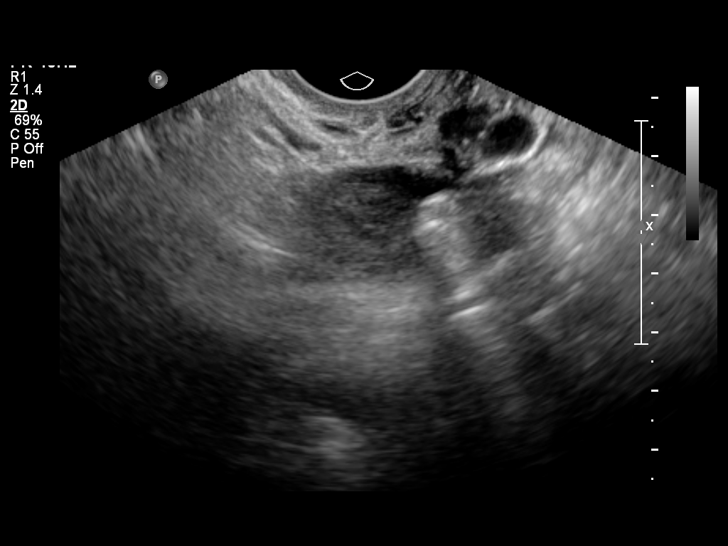
[im 48/48]
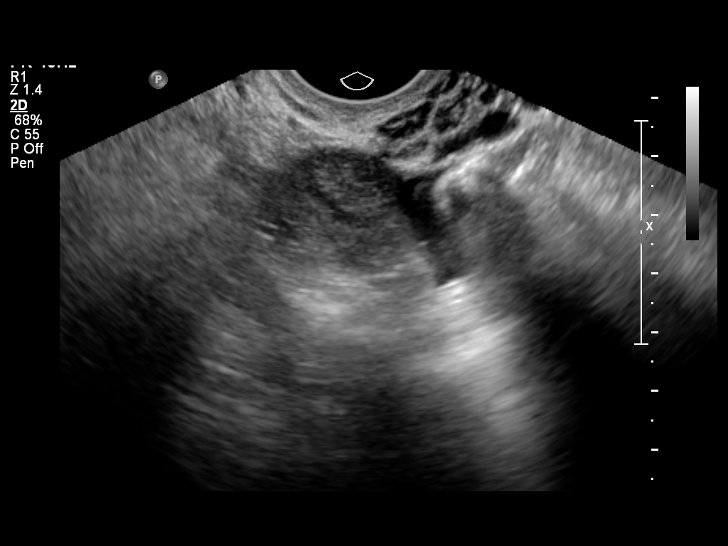

[14 of 25 positions shown; findings below may reference images not displayed]

FINDINGS: Uterus:  9.2 x 4.8 x 5.3 cm.  No fibroids or other uterine mass
identified.

Endometrium: Double layer thickness measures 7 mm transvaginally.
No focal lesion visualized.

Right ovary: 1.9 x 1.5 x 1.6 cm. Normal appearance.  No adnexal
mass identified.

Left ovary: 2.9 x 1.9 x 1.6 cm.  Normal appearance.  No adnexal
mass identified.

Other Findings:  No free fluid
IMPRESSION: Negative.  No evidence of pelvic mass or other significant
abnormality.

## 2015-04-22 IMAGING — CR DG CHEST 2V
2 series · 2 of 2 positions shown · non-contrast
Comparison: None.

CLINICAL DATA: Chest pain

EXAM:
CHEST  2 VIEW

[w chest pa]
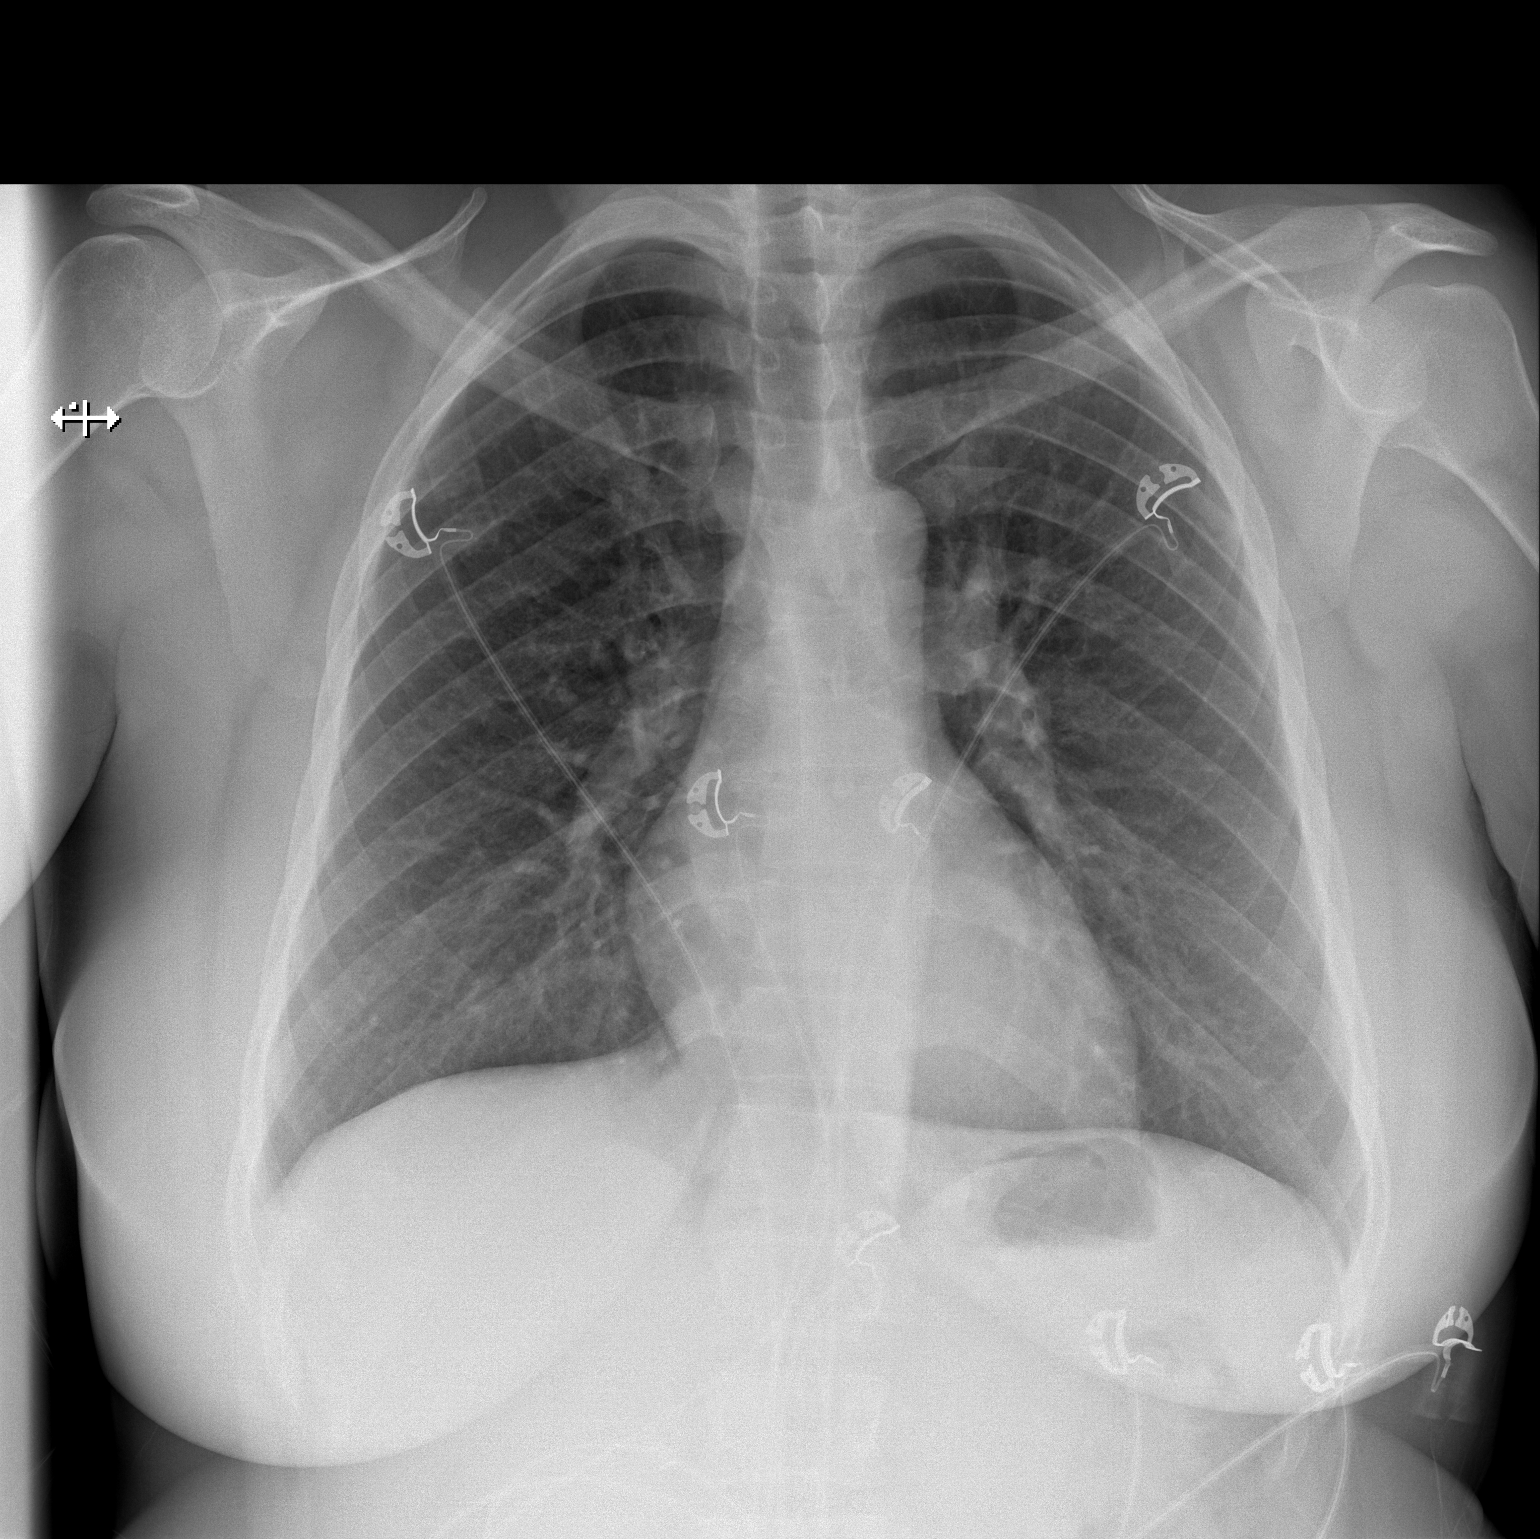

[w chest lat]
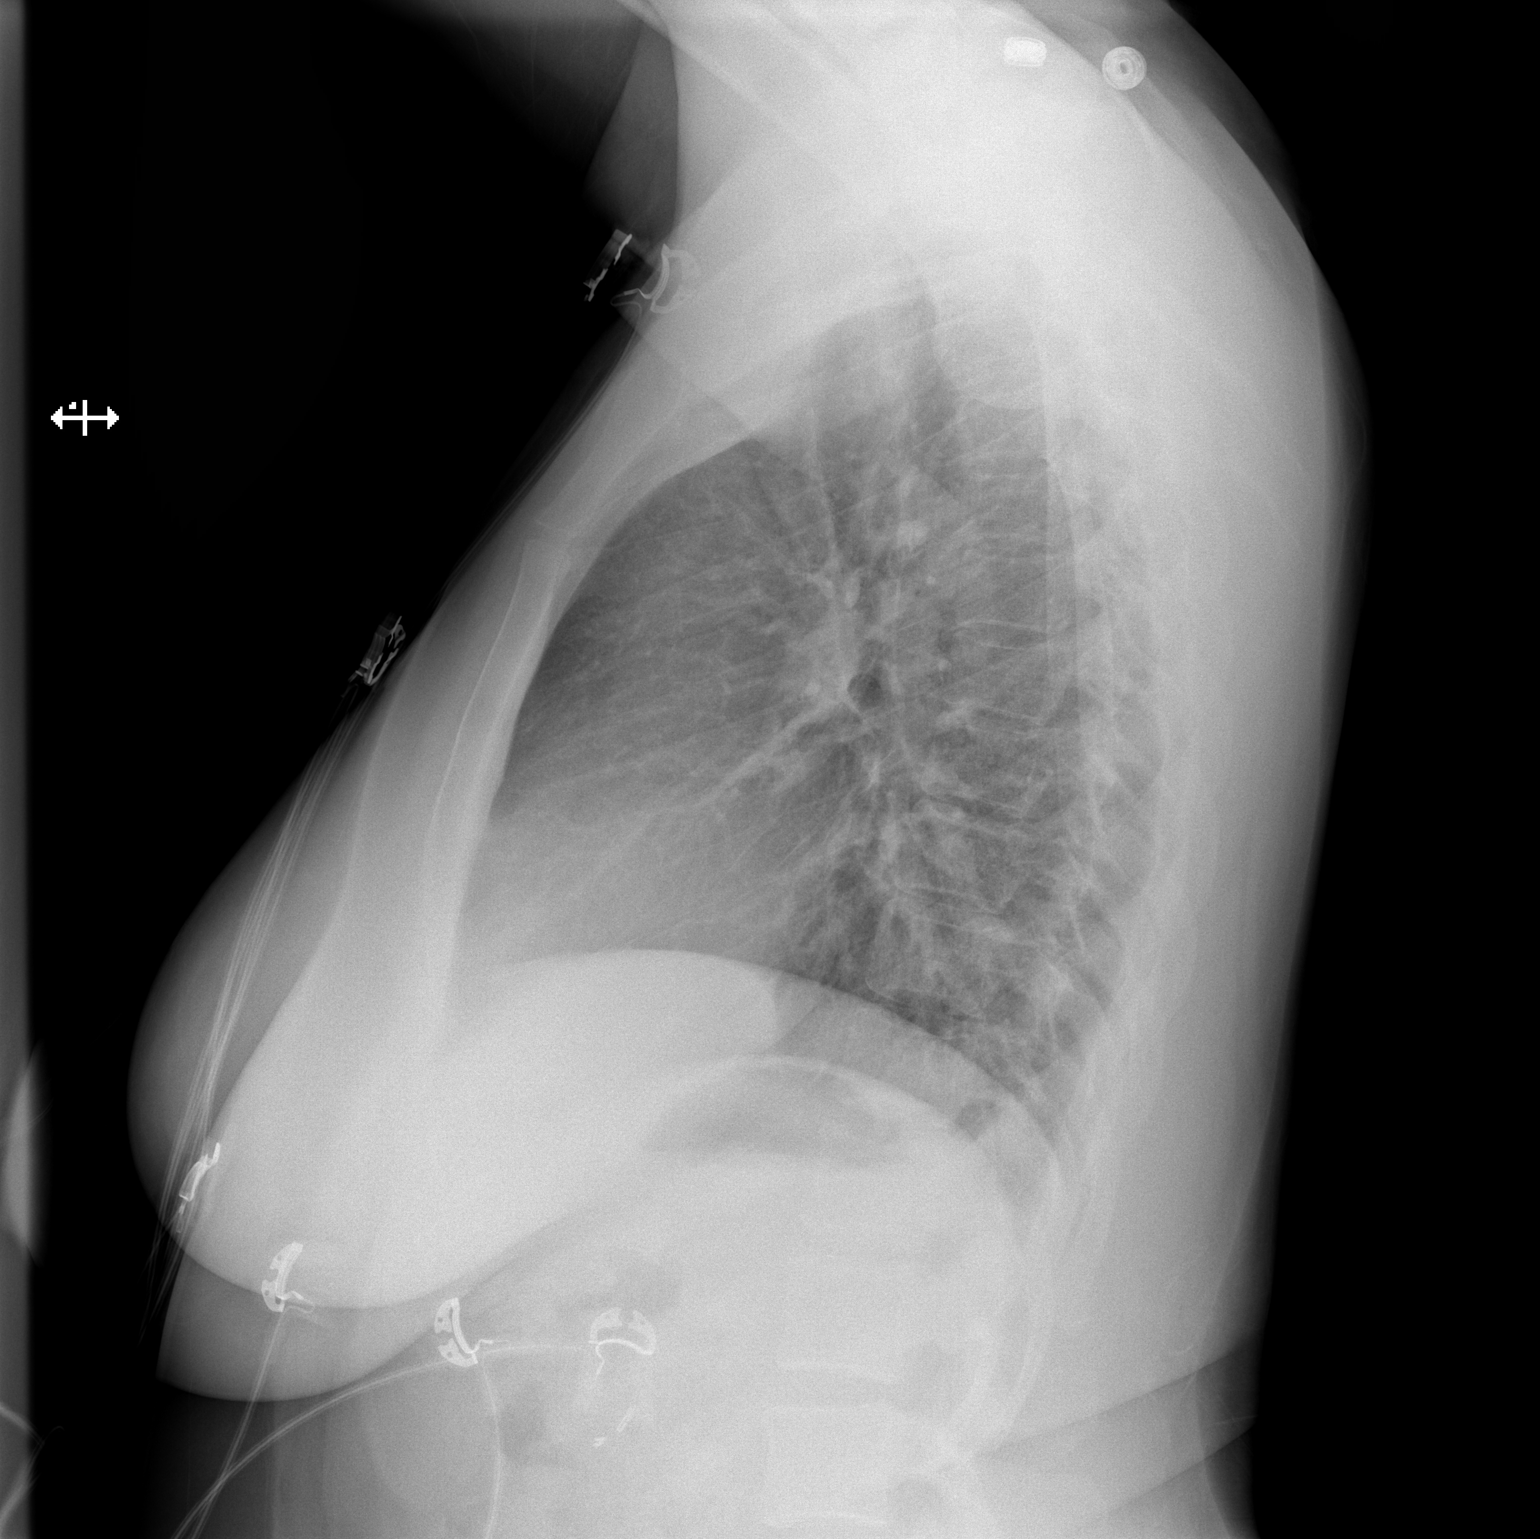

[2 of 2 positions shown; findings below may reference images not displayed]

FINDINGS: The lungs are clear. Heart size and pulmonary vascularity are
normal. No adenopathy. No pneumothorax. No bone lesions.
IMPRESSION: No abnormality noted.

## 2015-11-12 ENCOUNTER — Emergency Department (HOSPITAL_COMMUNITY)
Admission: EM | Admit: 2015-11-12 | Discharge: 2015-11-12 | Disposition: A | Discharge status: Home / Self Care | Payer: Managed Care, Other (non HMO) | Attending: Emergency Medicine | Admitting: Emergency Medicine

## 2015-11-12 ENCOUNTER — Encounter (HOSPITAL_COMMUNITY): Payer: Self-pay | Admitting: Emergency Medicine

## 2015-11-12 DIAGNOSIS — F17200 Nicotine dependence, unspecified, uncomplicated: Secondary | ICD-10-CM | POA: Insufficient documentation

## 2015-11-12 DIAGNOSIS — K0889 Other specified disorders of teeth and supporting structures: Secondary | ICD-10-CM | POA: Diagnosis not present

## 2015-11-12 MED ORDER — PENICILLIN V POTASSIUM 500 MG PO TABS: 500.0000 mg | ORAL_TABLET | Freq: Four times a day (QID) | ORAL | Status: DC

## 2015-11-12 MED ORDER — TRAMADOL HCL 50 MG PO TABS: 50.0000 mg | ORAL_TABLET | Freq: Four times a day (QID) | ORAL | Status: DC | PRN

## 2015-11-12 MED ORDER — TRAMADOL HCL 50 MG PO TABS
50.0000 mg | ORAL_TABLET | Freq: Once | ORAL | Status: AC
  Administered 2015-11-12: 50 mg via ORAL
  Filled 2015-11-12: qty 1

## 2015-11-12 NOTE — ED Notes (Signed)
Pt c/o tooth pain that got worse last night. Unsure if a cavity fell out.

## 2015-11-12 NOTE — ED Provider Notes (Signed)
CSN: [id number]     Arrival date & time 11/12/15  1106 History   First MD Initiated Contact with Patient 11/12/15 1125     Chief Complaint  Patient presents with  . Dental Pain     (Consider location/radiation/quality/duration/timing/severity/associated sxs/prior Treatment) HPI Patient presents to the emergency department with dental pain that started 2 days ago.  The patient states that she thinks the filling came out of her tooth and that is where the pain originated.  She states that several teeth hurt along the right upper dentition.  Patient states she does not have a dentist.  She denies any nausea, vomiting, shortness of breath, difficulty swallowing, difficulty breathing, tongue swelling, neck swelling, fever or syncope.  Patient states she used Orajel with minimal relief of her symptoms.  Past Medical History  Diagnosis Date  . Urinary tract infection   . Abnormal Pap smear     colpo   Past Surgical History  Procedure Laterality Date  . Cholecystectomy    . Tubal ligation     Family History  Problem Relation Age of Onset  . Cancer Father     cancer  . Hypertension Maternal Aunt   . Kidney disease Paternal Aunt     2 aunts- died from kidney failure  . Cancer Maternal Grandfather     stomach   Social History  Substance Use Topics  . Smoking status: Current Some Day Smoker  . Smokeless tobacco: Never Used  . Alcohol Use: No     Comment: socially   OB History    Gravida Para Term Preterm AB TAB SAB Ectopic Multiple Living   4 4 4  0 0 0 0 0 0 4     Review of Systems All other systems negative except as documented in the HPI. All pertinent positives and negatives as reviewed in the HPI.   Allergies  Aspirin; Ibuprofen; and Other  Home Medications   Prior to Admission medications   Medication Sig Start Date End Date Taking? Authorizing Provider  traMADol (ULTRAM) 50 MG tablet Take 1 tablet (50 mg total) by mouth every 6 (six) hours as needed for pain. 02/20/13    Cathren LaineKevin Steinl, MD   There were no vitals taken for this visit. Physical Exam  Constitutional: She appears well-developed and well-nourished. No distress.  HENT:  Head: Normocephalic and atraumatic.  Mouth/Throat:    Cardiovascular: Normal rate, regular rhythm and normal heart sounds.   Pulmonary/Chest: Effort normal.  Nursing note and vitals reviewed.   ED Course  Procedures (including critical care time) Labs Review Labs Reviewed - No data to display  Imaging Review No results found. I have personally reviewed and evaluated these images and lab results as part of my medical decision-making.  I advised the patient that we refer her to the on-call dentist.  Advised to return here as needed.  Patient is advised rest with warm water and peroxide 3 times a day   Charlestine NightChristopher Tysen Roesler, PA-C 11/12/15 1132  Melene Planan Floyd, DO 11/12/15 1133

## 2015-11-12 NOTE — Discharge Instructions (Signed)
Return here as needed. You will need to see the dentist provided for follow up

## 2017-03-07 ENCOUNTER — Inpatient Hospital Stay (HOSPITAL_COMMUNITY)
Admission: AD | Admit: 2017-03-07 | Discharge: 2017-03-07 | Disposition: A | Discharge status: Home / Self Care | Payer: Managed Care, Other (non HMO) | Source: Ambulatory Visit | Attending: Obstetrics and Gynecology | Admitting: Obstetrics and Gynecology

## 2017-03-07 ENCOUNTER — Encounter (HOSPITAL_COMMUNITY): Payer: Self-pay | Admitting: *Deleted

## 2017-03-07 DIAGNOSIS — M545 Low back pain: Secondary | ICD-10-CM | POA: Insufficient documentation

## 2017-03-07 DIAGNOSIS — R102 Pelvic and perineal pain: Secondary | ICD-10-CM | POA: Insufficient documentation

## 2017-03-07 DIAGNOSIS — Z87891 Personal history of nicotine dependence: Secondary | ICD-10-CM | POA: Insufficient documentation

## 2017-03-07 DIAGNOSIS — Z9049 Acquired absence of other specified parts of digestive tract: Secondary | ICD-10-CM | POA: Insufficient documentation

## 2017-03-07 LAB — URINALYSIS, ROUTINE W REFLEX MICROSCOPIC
Bacteria, UA: NONE SEEN
Bilirubin Urine: NEGATIVE
GLUCOSE, UA: NEGATIVE mg/dL
KETONES UR: NEGATIVE mg/dL
Leukocytes, UA: NEGATIVE
Nitrite: NEGATIVE
PH: 5 (ref 5.0–8.0)
Protein, ur: NEGATIVE mg/dL
Specific Gravity, Urine: 1.023 (ref 1.005–1.030)

## 2017-03-07 LAB — POCT PREGNANCY, URINE: Preg Test, Ur: NEGATIVE

## 2017-03-07 MED ORDER — TRAMADOL HCL 50 MG PO TABS
50.0000 mg | ORAL_TABLET | Freq: Four times a day (QID) | ORAL | 0 refills | Status: DC | PRN
Start: 2017-03-07 — End: 2022-03-18

## 2017-03-07 NOTE — MAU Note (Signed)
Pain in low back, rt side.  Has been going on some wks. Also having a little pain in lower pelvis, off and on for a while.

## 2017-03-07 NOTE — MAU Provider Note (Signed)
Patient Rebecca Figueroa is a 36 y.o. [id number] Non-pregnant female here with complaints of vaginal pain and back pain.   She denies abnormal bleeding or discharge, pain during intercourse, unusual odor, and dysuria.   She called her ob-gyn provider who was unable to see her until January. She came here to get checked out because she is worried it is cancer. She does not think it is muscular-skeletal or nerve-related. It sometimes radiates into her buttocks.  She does not think it is UTI-related.  She declines wet prep and GC CT testing today  Upon discussing patient's case with Dr. Dion Body, it appears patient had appt today with Dr. Maturin Figueroa, but she showed up late and [facility] could not see her. She was offered an appt on 11-19 but patient wanted to be seen today so she came here.    History     CSN: [id number]  Arrival date and time: 03/07/17 1212   First Provider Initiated Contact with Patient 03/07/17 1330      Chief Complaint  Patient presents with  . Back Pain  . Pelvic Pain   Back Pain  This is a new problem. The current episode started more than 1 month ago. The problem occurs intermittently. The problem is unchanged. The pain is present in the lumbar spine. The quality of the pain is described as aching. The pain is at a severity of 7/10. Worse during: comes and goes. Associated symptoms include pelvic pain. Pertinent negatives include no abdominal pain, chest pain, dysuria or fever.  Pelvic Pain  The patient's primary symptoms include pelvic pain. Associated symptoms include back pain. Pertinent negatives include no abdominal pain, dysuria or fever.  The pain is located on her right side, to the right of her spine.   OB History    Gravida Para Term Preterm AB Living   4 4 4  0 0 4   SAB TAB Ectopic Multiple Live Births   0 0 0 0 4      Past Medical History:  Diagnosis Date  . Abnormal Pap smear    colpo  . Urinary tract infection     Past Surgical History:   Procedure Laterality Date  . CHOLECYSTECTOMY    . TUBAL LIGATION      Family History  Problem Relation Age of Onset  . Cancer Father        cancer  . Kidney disease Paternal Aunt        2 aunts- died from kidney failure  . Cancer Maternal Grandfather        stomach  . Hypertension Maternal Aunt     Social History  Substance Use Topics  . Smoking status: Former Games developer  . Smokeless tobacco: Never Used  . Alcohol use No     Comment: socially    Allergies:  Allergies  Allergen Reactions  . Aspirin Anaphylaxis  . Ibuprofen     Facial swelling, throat started closing  . Other Hives    Food=shellfish    Prescriptions Prior to Admission  Medication Sig Dispense Refill Last Dose  . penicillin v potassium (VEETID) 500 MG tablet Take 1 tablet (500 mg total) by mouth 4 (four) times daily. 28 tablet 0   . traMADol (ULTRAM) 50 MG tablet Take 1 tablet (50 mg total) by mouth every 6 (six) hours as needed for severe pain. 15 tablet 0     Review of Systems  Constitutional: Negative.  Negative for fever.  HENT: Negative.   Respiratory: Negative.  Cardiovascular: Negative.  Negative for chest pain.  Gastrointestinal: Negative.  Negative for abdominal pain.  Genitourinary: Positive for pelvic pain and vaginal pain. Negative for dysuria.  Musculoskeletal: Positive for back pain.  Skin: Negative.   Neurological: Negative.   Psychiatric/Behavioral: Negative.    Physical Exam   Blood pressure 112/67, pulse 72, temperature 98.3 F (36.8 C), temperature source Oral, resp. rate 18, weight 216 lb 4 oz (98.1 kg), last menstrual period 02/06/2017, SpO2 99 %.  Physical Exam  Constitutional: She is oriented to person, place, and time. She appears well-developed and well-nourished.  HENT:  Head: Normocephalic.  Neck: Normal range of motion.  Respiratory: Effort normal.  GI: Soft.  Genitourinary:  Genitourinary Comments: NEFG; no CMT, no suprapubic tenderness. Slight tenderness to  deep palpation on the right side.   Musculoskeletal: Normal range of motion.  Neurological: She is alert and oriented to person, place, and time.  Skin: Skin is warm and dry.    MAU Course  Procedures  MDM -UPT negative -UA negative Reassured patient that it is most likely a cyst or fibroid but that it is not an emergency today and she will not be getting an US. Dr. Dion BodyVarnado has ordered an US for her at her appt on the 19th.   Assessment and Plan   1. Pelvic pain    2. Patient stable for discharge with RX for Tramadol. Counseled patient that she needs to keep appt on the 19. 3. Discussed patient's case with Dr. Dion BodyVarnado, who agrees with plan of care.   Charlesetta GaribaldiKathryn Lorraine Kawon Willcutt 03/07/2017, 1:38 PM

## 2017-03-07 NOTE — Discharge Instructions (Signed)
Uterine Fibroids Uterine fibroids are tissue masses (tumors). They are also called leiomyomas. They can develop inside of a woman's womb (uterus). They can grow very large. Fibroids are not cancerous (benign). Most fibroids do not require medical treatment. Follow these instructions at home:  Keep all follow-up visits as told by your doctor. This is important.  Take medicines only as told by your doctor. ? If you were prescribed a hormone treatment, take the hormone medicines exactly as told. ? Do not take aspirin. It can cause bleeding.  Ask your doctor about taking iron pills and increasing the amount of dark green, leafy vegetables in your diet. These actions can help to boost your blood iron levels.  Pay close attention to your period. Tell your doctor about any changes, such as: ? Increased blood flow. This may require you to use more pads or tampons than usual per month. ? A change in the number of days that your period lasts per month. ? A change in symptoms that come with your period, such as back pain or cramping in your belly area (abdomen). Contact a doctor if:  You have pain in your back or the area between your hip bones (pelvic area) that is not controlled by medicines.  You have pain in your abdomen that is not controlled with medicines.  You have an increase in bleeding between and during periods.  You soak tampons or pads in a half hour or less.  You feel lightheaded.  You feel extra tired.  You feel weak. Get help right away if:  You pass out (faint).  You have a sudden increase in pelvic pain. This information is not intended to replace advice given to you by your health care provider. Make sure you discuss any questions you have with your health care provider. Document Released: 05/27/2010 Document Revised: 12/24/2015 Document Reviewed: 10/21/2013 Elsevier Interactive Patient Education  2018 Elsevier Inc.  

## 2017-03-20 ENCOUNTER — Encounter: Payer: Self-pay | Admitting: Nurse Practitioner

## 2017-03-20 ENCOUNTER — Ambulatory Visit (INDEPENDENT_AMBULATORY_CARE_PROVIDER_SITE_OTHER): Payer: 59 | Admitting: Nurse Practitioner

## 2017-03-20 ENCOUNTER — Other Ambulatory Visit (INDEPENDENT_AMBULATORY_CARE_PROVIDER_SITE_OTHER): Payer: 59

## 2017-03-20 VITALS — BP 112/70 | HR 59 | Temp 98.8°F | Resp 16 | Ht 67.0 in | Wt 216.0 lb

## 2017-03-20 DIAGNOSIS — Z0001 Encounter for general adult medical examination with abnormal findings: Secondary | ICD-10-CM

## 2017-03-20 DIAGNOSIS — R6889 Other general symptoms and signs: Secondary | ICD-10-CM

## 2017-03-20 DIAGNOSIS — Z114 Encounter for screening for human immunodeficiency virus [HIV]: Secondary | ICD-10-CM

## 2017-03-20 DIAGNOSIS — Z13220 Encounter for screening for lipoid disorders: Secondary | ICD-10-CM

## 2017-03-20 DIAGNOSIS — Z0000 Encounter for general adult medical examination without abnormal findings: Secondary | ICD-10-CM

## 2017-03-20 LAB — LIPID PANEL
CHOL/HDL RATIO: 4
Cholesterol: 193 mg/dL (ref 0–200)
HDL: 43.6 mg/dL (ref >39.00)
LDL CALC: 129 mg/dL — AB (ref 0–99)
NONHDL: 149.62
Triglycerides: 101 mg/dL (ref 0.0–149.0)
VLDL: 20.2 mg/dL (ref 0.0–40.0)

## 2017-03-20 LAB — TSH: TSH: 0.59 u[IU]/mL (ref 0.35–4.50)

## 2017-03-20 NOTE — Assessment & Plan Note (Signed)
HIV screening ordered today. She is seeing GYN for womens health next week. Health maintenance up to date.  Healthy diet and exercise discussed including reduced meat and 1/2 plate of veggies at meals, getting 150 minutes of exercise per week. She is very motivated to begin incorporating exercise into her lifestyle. Smoking cessation discussed, she is a current some day smoker but is not fully ready to quit yet.  Lipid screening ordered today.  Preventive care handout given today

## 2017-03-20 NOTE — Patient Instructions (Signed)
Please head downstairs for lab work.  Please work on your diet and exercise as we discussed. Remember half of your plate should be veggies, one-fourth carbs, one-fourth meat, and don't eat meat at every meal. Also, remember to stay away from sugary drinks. I'd like for you to start incorporating exercise into your daily schedule. Start at 10 minutes a day, working up to 30 minutes five times a week.  Return in 1 year for an annual physical, or sooner if you need me.  It was nice to meet you. Welcome to [facility]!   Preventive Care 18-39 Years, Female Preventive care refers to lifestyle choices and visits with your health care provider that can promote health and wellness. What does preventive care include?  A yearly physical exam. This is also called an annual well check.  Dental exams once or twice a year.  Routine eye exams. Ask your health care provider how often you should have your eyes checked.  Personal lifestyle choices, including: ? Daily care of your teeth and gums. ? Regular physical activity. ? Eating a healthy diet. ? Avoiding tobacco and drug use. ? Limiting alcohol use. ? Practicing safe sex. ? Taking vitamin and mineral supplements as recommended by your health care provider. What happens during an annual well check? The services and screenings done by your health care provider during your annual well check will depend on your age, overall health, lifestyle risk factors, and family history of disease. Counseling Your health care provider may ask you questions about your:  Alcohol use.  Tobacco use.  Drug use.  Emotional well-being.  Home and relationship well-being.  Sexual activity.  Eating habits.  Work and work Statistician.  Method of birth control.  Menstrual cycle.  Pregnancy history.  Screening You may have the following tests or measurements:  Height, weight, and BMI.  Diabetes screening. This is done by checking your blood sugar  (glucose) after you have not eaten for a while (fasting).  Blood pressure.  Lipid and cholesterol levels. These may be checked every 5 years starting at age 65.  Skin check.  Hepatitis C blood test.  Hepatitis B blood test.  Sexually transmitted disease (STD) testing.  BRCA-related cancer screening. This may be done if you have a family history of breast, ovarian, tubal, or peritoneal cancers.  Pelvic exam and Pap test. This may be done every 3 years starting at age 61. Starting at age 70, this may be done every 5 years if you have a Pap test in combination with an HPV test.  Discuss your test results, treatment options, and if necessary, the need for more tests with your health care provider. Vaccines Your health care provider may recommend certain vaccines, such as:  Influenza vaccine. This is recommended every year.  Tetanus, diphtheria, and acellular pertussis (Tdap, Td) vaccine. You may need a Td booster every 10 years.  Varicella vaccine. You may need this if you have not been vaccinated.  HPV vaccine. If you are 78 or younger, you may need three doses over 6 months.  Measles, mumps, and rubella (MMR) vaccine. You may need at least one dose of MMR. You may also need a second dose.  Pneumococcal 13-valent conjugate (PCV13) vaccine. You may need this if you have certain conditions and were not previously vaccinated.  Pneumococcal polysaccharide (PPSV23) vaccine. You may need one or two doses if you smoke cigarettes or if you have certain conditions.  Meningococcal vaccine. One dose is recommended if you are age  19-21 years and a Market researcher living in a residence hall, or if you have one of several medical conditions. You may also need additional booster doses.  Hepatitis A vaccine. You may need this if you have certain conditions or if you travel or work in places where you may be exposed to hepatitis A.  Hepatitis B vaccine. You may need this if you have  certain conditions or if you travel or work in places where you may be exposed to hepatitis B.  Haemophilus influenzae type b (Hib) vaccine. You may need this if you have certain risk factors.  Talk to your health care provider about which screenings and vaccines you need and how often you need them. This information is not intended to replace advice given to you by your health care provider. Make sure you discuss any questions you have with your health care provider. Document Released: 06/20/2001 Document Revised: 01/12/2016 Document Reviewed: 02/23/2015 Elsevier Interactive Patient Education  2017 [employer].

## 2017-03-20 NOTE — Progress Notes (Addendum)
Subjective:    Patient ID: Rebecca Figueroa, female    DOB: [birth year], 36 y.o.   MRN: [id number]  HPI Rebecca Figueroa is a 36 yo female who presents today to establish care. Patient presents today for complete physical.  Immunizations: Flu-up to date Tetanus-up to date Pap Smear: over 4 years ago, GYN appt scheduled next week Smoker: occasional we talked about quitting-1 pack for month Vision: no Dental: no Diet: Breakfast- eggs grits bacon muffin Lunch- vegetables, sometimes meat Dinner- Cooks at home [employer] Inc[employer] food- rare Drinks-water, sodas, tea Exercise: none, motivated to start, shes participating in a 5K this weekend  Review of Systems  Constitutional: Negative.  Negative for activity change and appetite change.  HENT: Negative for dental problem and voice change.   Eyes: Positive for discharge. Negative for visual disturbance.  Respiratory: Negative for cough and shortness of breath.   Cardiovascular: Negative for chest pain and palpitations.  Gastrointestinal: Negative for constipation and diarrhea.  Endocrine: Positive for heat intolerance. Negative for cold intolerance.  Genitourinary: Negative for difficulty urinating and hematuria.  Musculoskeletal: Positive for arthralgias. Negative for myalgias.  Skin: Negative for rash.  Allergic/Immunologic: Negative for environmental allergies and food allergies.  Neurological: Negative for speech difficulty and headaches.  Hematological: Does not bruise/bleed easily.  Psychiatric/Behavioral:       Negative for depression. Mild anxiety.    Past Medical History:  Diagnosis Date  . Abnormal Pap smear    colpo  . Allergy   . Urinary tract infection      Social History   Socioeconomic History  . Marital status: Single    Spouse name: Not on file  . Number of children: Not on file  . Years of education: Not on file  . Highest education level: Not on file  Social Needs  . Financial resource strain: Not on file  .  Food insecurity - worry: Not on file  . Food insecurity - inability: Not on file  . Transportation needs - medical: No  . Transportation needs - non-medical: No  Occupational History  . Occupation: Psychologist, sport and exercisenurse tech    Comment: [employer]  Tobacco Use  . Smoking status: Current Some Day Smoker  . Smokeless tobacco: Never Used  Substance and Sexual Activity  . Alcohol use: Yes    Comment: socially  . Drug use: No  . Sexual activity: Yes    Partners: Male    Birth control/protection: Surgical  Other Topics Concern  . Not on file  Social History Narrative   Fun: crossword puzzles, relaxes at home    Past Surgical History:  Procedure Laterality Date  . CHOLECYSTECTOMY    . KNEE ARTHROSCOPY    . TUBAL LIGATION      Family History  Problem Relation Age of Onset  . Cancer Father        cancer  . Kidney disease Paternal Aunt        2 aunts- died from kidney failure  . Cancer Maternal Grandfather        stomach  . Hypertension Maternal Aunt     Allergies  Allergen Reactions  . Aspirin Anaphylaxis  . Ibuprofen     Facial swelling, throat started closing  . Other Hives    Food=shellfish    Current Outpatient Medications on File Prior to Visit  Medication Sig Dispense Refill  . traMADol (ULTRAM) 50 MG tablet Take 1 tablet (50 mg total) by mouth every 6 (six) hours as needed for severe pain. (Patient not taking:  Reported on 03/20/2017) 15 tablet 0   No current facility-administered medications on file prior to visit.        Objective:   Physical Exam  BP 112/70 (BP Location: Left Arm, Patient Position: Sitting, Cuff Size: Large)   Pulse (!) 59   Temp 98.8 F (37.1 C) (Oral)   Resp 16   Ht 5\' 7"  (1.702 m)   Wt 216 lb (98 kg)   SpO2 97%   BMI 33.83 kg/m   General Appearance:    Alert, cooperative, no distress, appears stated age  Head:    Normocephalic, without obvious abnormality, atraumatic  Eyes:    PERRL, conjunctiva/corneas clear, EOM's intact, fundi    benign,  both eyes  Ears:    Normal TM's and external ear canals, both ears  Nose:   Nares normal, septum midline, mucosa normal, no drainage    or sinus tenderness  Throat:   Lips, mucosa, and tongue normal; teeth and gums normal  Neck:   Supple, symmetrical, trachea midline, no adenopathy;    thyroid:  no enlargement/tenderness/nodules; no carotid   bruit or JVD  Back:     Symmetric, no curvature, ROM normal, no CVA tenderness  Lungs:     Clear to auscultation bilaterally, respirations unlabored  Chest Wall:    No tenderness or deformity   Heart:    Regular rate and rhythm, S1 and S2 normal, no murmur, rub   or gallop     Abdomen:     Soft, non-tender, bowel sounds active all four quadrants,    no masses, no organomegaly        Extremities:   Extremities normal, atraumatic, no cyanosis or edema  Pulses:   2+ and symmetric all extremities  Skin:   Skin color, texture, turgor normal, no rashes or lesions  Lymph nodes:   Cervical, supraclavicular nodes normal  Neurologic:   CNII-XII intact, normal strength, sensation and reflexes    throughout        Assessment & Plan:  She will return in 1 year for annual physical or sooner if needed.  Intolerance to heat- reports intolerance to heat, mild anxiety. Obtain TSH to rule out thyroid etiology

## 2017-03-21 LAB — HIV ANTIBODY (ROUTINE TESTING W REFLEX): HIV 1&2 Ab, 4th Generation: NONREACTIVE

## 2017-03-26 ENCOUNTER — Other Ambulatory Visit (HOSPITAL_COMMUNITY)
Admission: RE | Admit: 2017-03-26 | Discharge: 2017-03-26 | Disposition: A | Discharge status: Home / Self Care | Payer: 59 | Source: Ambulatory Visit | Attending: Obstetrics and Gynecology | Admitting: Obstetrics and Gynecology

## 2017-03-26 ENCOUNTER — Other Ambulatory Visit: Payer: Self-pay | Admitting: Obstetrics and Gynecology

## 2017-03-26 DIAGNOSIS — Z124 Encounter for screening for malignant neoplasm of cervix: Secondary | ICD-10-CM | POA: Diagnosis not present

## 2017-04-02 LAB — CYTOLOGY - PAP
Diagnosis: NEGATIVE
HPV (WINDOPATH): NOT DETECTED

## 2017-04-22 ENCOUNTER — Emergency Department (HOSPITAL_COMMUNITY)
Admission: EM | Admit: 2017-04-22 | Discharge: 2017-04-22 | Disposition: A | Discharge status: Home / Self Care | Payer: 59 | Attending: Emergency Medicine | Admitting: Emergency Medicine

## 2017-04-22 ENCOUNTER — Other Ambulatory Visit: Payer: Self-pay

## 2017-04-22 ENCOUNTER — Encounter (HOSPITAL_COMMUNITY): Payer: Self-pay | Admitting: Emergency Medicine

## 2017-04-22 DIAGNOSIS — R600 Localized edema: Secondary | ICD-10-CM | POA: Insufficient documentation

## 2017-04-22 DIAGNOSIS — K0889 Other specified disorders of teeth and supporting structures: Secondary | ICD-10-CM | POA: Diagnosis not present

## 2017-04-22 DIAGNOSIS — R519 Headache, unspecified: Secondary | ICD-10-CM

## 2017-04-22 DIAGNOSIS — Z91013 Allergy to seafood: Secondary | ICD-10-CM | POA: Diagnosis not present

## 2017-04-22 DIAGNOSIS — J3489 Other specified disorders of nose and nasal sinuses: Secondary | ICD-10-CM | POA: Diagnosis not present

## 2017-04-22 DIAGNOSIS — F17200 Nicotine dependence, unspecified, uncomplicated: Secondary | ICD-10-CM | POA: Insufficient documentation

## 2017-04-22 DIAGNOSIS — R51 Headache: Secondary | ICD-10-CM

## 2017-04-22 MED ORDER — CLINDAMYCIN HCL 150 MG PO CAPS
450.0000 mg | ORAL_CAPSULE | Freq: Once | ORAL | Status: AC
  Administered 2017-04-22: 450 mg via ORAL
  Filled 2017-04-22: qty 3

## 2017-04-22 MED ORDER — CLINDAMYCIN HCL 150 MG PO CAPS: 450.0000 mg | ORAL_CAPSULE | Freq: Three times a day (TID) | ORAL | 0 refills | Status: AC

## 2017-04-22 NOTE — ED Provider Notes (Signed)
[facility]   CSN: [id number] Arrival date & time: 04/22/17  1009     History   Chief Complaint Chief Complaint  Patient presents with  . Abscess  . Facial Pain    HPI Rebecca Figueroa is a 36 y.o. female who presents to the emergency department with a chief complaint of right-sided facial pain, described as pressure, and swelling that began 2 nights ago with right-sided maxillary dental pain that has been worsening over the last few months.  She states that she feels as if the pain in her teeth is similar to the pain in her right cheek.  She denies fever, chills, nasal congestion, headache, rhinorrhea, or URI sx. no neck pain, stiffness, chest pain or shortness of breath.  No swelling of the gums or purulent drainage from the teeth.  No eye pain or pain with movement of the eyes. She treated her symptoms with 2 tablets of Tylenol PM yesterday and took 2 tablets of Bactrim from a friend without improvement.  No other treatment prior to arrival.  She reports that she is not currently established with a dentist.  The history is provided by the patient. No language interpreter was used.    Past Medical History:  Diagnosis Date  . Abnormal Pap smear    colpo  . Allergy   . Urinary tract infection     Patient Active Problem List   Diagnosis Date Noted  . Annual physical exam 03/20/2017    Past Surgical History:  Procedure Laterality Date  . CHOLECYSTECTOMY    . KNEE ARTHROSCOPY    . TUBAL LIGATION      OB History    Gravida Para Term Preterm AB Living   4 4 4  0 0 4   SAB TAB Ectopic Multiple Live Births   0 0 0 0 4       Home Medications    Prior to Admission medications   Medication Sig Start Date End Date Taking? Authorizing Provider  acetaminophen (TYLENOL) 500 MG tablet Take 1,000 mg by mouth every 6 (six) hours as needed for mild pain.   Yes [provider]  clindamycin (CLEOCIN) 150 MG capsule  Take 3 capsules (450 mg total) by mouth 3 (three) times daily for 7 days. 04/22/17 04/29/17  Lovina Zuver A, PA-C  traMADol (ULTRAM) 50 MG tablet Take 1 tablet (50 mg total) by mouth every 6 (six) hours as needed for severe pain. Patient not taking: Reported on 03/20/2017 03/07/17   Marylene LandKooistra, Kathryn Lorraine, CNM    Family History Family History  Problem Relation Age of Onset  . Cancer Father        cancer  . Kidney disease Paternal Aunt        2 aunts- died from kidney failure  . Cancer Maternal Grandfather        stomach  . Hypertension Maternal Aunt     Social History Social History   Tobacco Use  . Smoking status: Current Some Day Smoker  . Smokeless tobacco: Never Used  Substance Use Topics  . Alcohol use: Yes    Comment: socially  . Drug use: No     Allergies   Aspirin; Ibuprofen; Other; and Shellfish allergy   Review of Systems Review of Systems  Constitutional: Negative for chills and fever.  HENT: Positive for dental problem, facial swelling, sinus pressure and sinus pain. Negative for congestion, sneezing and sore throat.   Eyes: Negative for visual disturbance.  Respiratory: Negative for shortness of breath.   Cardiovascular: Negative for chest pain.  Gastrointestinal: Negative for abdominal pain.  Musculoskeletal: Negative for neck pain and neck stiffness.  Allergic/Immunologic: Negative for immunocompromised state.  Neurological: Negative for headaches.     Physical Exam Updated Vital Signs BP 102/85   Pulse 73   Temp 98.4 F (36.9 C) (Oral)   Resp 16   Ht 5\' 7"  (1.702 m)   Wt 99.8 kg (220 lb)   LMP 04/11/2017   SpO2 100%   BMI 34.46 kg/m   Physical Exam  Constitutional: No distress.  HENT:  Head: Normocephalic.  Right Ear: Tympanic membrane and ear canal normal.  Left Ear: Tympanic membrane and ear canal normal.  Nose: Rhinorrhea present. No mucosal edema. Right sinus exhibits maxillary sinus tenderness. Right sinus exhibits no  frontal sinus tenderness. Left sinus exhibits no maxillary sinus tenderness and no frontal sinus tenderness.  Mouth/Throat: Uvula is midline, oropharynx is clear and moist and mucous membranes are normal.    Eyes: Conjunctivae and EOM are normal. Pupils are equal, round, and reactive to light. Right eye exhibits no discharge. Left eye exhibits no discharge.  Neck: Normal range of motion. Neck supple.  No meningeal signs.  Cardiovascular: Normal rate, regular rhythm and normal heart sounds. Exam reveals no gallop and no friction rub.  No murmur heard. Pulmonary/Chest: Effort normal and breath sounds normal. No stridor. No respiratory distress. She has no wheezes. She has no rales. She exhibits no tenderness.  Abdominal: Soft. She exhibits no distension.  Neurological: She is alert.  Skin: Skin is warm. No rash noted.  Psychiatric: Her behavior is normal.  Nursing note and vitals reviewed.    ED Treatments / Results  Labs (all labs ordered are listed, but only abnormal results are displayed) Labs Reviewed - No data to display  EKG  EKG Interpretation None       Radiology No results found.  Procedures Procedures (including critical care time)  Medications Ordered in ED Medications  clindamycin (CLEOCIN) capsule 450 mg (450 mg Oral Given 04/22/17 1351)     Initial Impression / Assessment and Plan / ED Course  I have reviewed the triage vital signs and the nursing notes.  Pertinent labs & imaging results that were available during my care of the patient were reviewed by me and considered in my medical decision making (see chart for details).     Patient with worsening toothache causing mild right-sided facial swelling.  On physical exam, she is tender over the right maxillary sinus.  Given history and physical exam, doubt sinusitis.  Low suspicion for preseptal or orbital cellulitis.  No gross abscess.  Exam unconcerning for Ludwig's angina or spread of infection.  Will  treat with clindamycin and supportive treatment.  Urged patient to follow-up with dentist.  Strict return precautions given.  The patient was discussed with Dr. Fredderick PhenixBelfi, attending physician who is in agreement with the plan.  She is hemodynamically stable.  No acute distress.  The patient is safe for discharge at this time.  Final Clinical Impressions(s) / ED Diagnoses   Final diagnoses:  Pain, dental  Facial pain    ED Discharge Orders        Ordered    clindamycin (CLEOCIN) 150 MG capsule  3 times daily     04/22/17 1344       Kerline Trahan A, PA-C 04/22/17 1407    Rolan BuccoBelfi, Melanie, MD 04/22/17 1419

## 2017-04-22 NOTE — ED Notes (Signed)
Patient verbalizes understanding of discharge instructions. Opportunity for questioning and answers were provided. 

## 2017-04-22 NOTE — ED Triage Notes (Signed)
Pt states has had a right upper tooth/gum pain for a while but now has pain up into cheek up to below the eye, pain with palpation in cheek/face.

## 2017-04-22 NOTE — Discharge Instructions (Signed)
Take 450 mg of clindamycin once every 8 hours for the next 7 days.  You can apply cool compresses to the face, but do not apply heat directly to the cheek or to the mouth.  Take 1000 mg of Tylenol once every 8 hours to help with pain.  Do not take more than 4000 mg of Tylenol in a 24-hour period.  Please use the provided dental resource list to get established with a dentist. Please follow up as soon as possible.  If you develop new or worsening symptoms, including difficulty swallowing, fever that does not improve a Tylenol, redness, warmth over the right cheek, severe pain with movement of the eyes, visual changes, or severe neck pain or stiffness, please return to the emergency department for reevaluation.

## 2018-03-21 ENCOUNTER — Encounter: Payer: 59 | Admitting: Nurse Practitioner

## 2018-04-22 ENCOUNTER — Encounter: Payer: Self-pay | Admitting: Nurse Practitioner

## 2018-05-17 ENCOUNTER — Encounter: Payer: Self-pay | Admitting: Nurse Practitioner

## 2018-05-17 DIAGNOSIS — Z0289 Encounter for other administrative examinations: Secondary | ICD-10-CM

## 2019-03-19 ENCOUNTER — Emergency Department (HOSPITAL_COMMUNITY): Payer: Self-pay

## 2019-03-19 ENCOUNTER — Encounter (HOSPITAL_COMMUNITY): Payer: Self-pay

## 2019-03-19 ENCOUNTER — Emergency Department (HOSPITAL_COMMUNITY)
Admission: EM | Admit: 2019-03-19 | Discharge: 2019-03-19 | Disposition: A | Discharge status: Home / Self Care | Payer: Self-pay | Attending: Emergency Medicine | Admitting: Emergency Medicine

## 2019-03-19 ENCOUNTER — Other Ambulatory Visit: Payer: Self-pay

## 2019-03-19 DIAGNOSIS — N200 Calculus of kidney: Secondary | ICD-10-CM

## 2019-03-19 DIAGNOSIS — R109 Unspecified abdominal pain: Secondary | ICD-10-CM

## 2019-03-19 DIAGNOSIS — Z87891 Personal history of nicotine dependence: Secondary | ICD-10-CM | POA: Insufficient documentation

## 2019-03-19 DIAGNOSIS — R10A1 Flank pain, right side: Secondary | ICD-10-CM

## 2019-03-19 LAB — CBC
HCT: 42.8 % (ref 36.0–46.0)
Hemoglobin: 14.3 g/dL (ref 12.0–15.0)
MCH: 31.7 pg (ref 26.0–34.0)
MCHC: 33.4 g/dL (ref 30.0–36.0)
MCV: 94.9 fL (ref 80.0–100.0)
Platelets: 314 10*3/uL (ref 150–400)
RBC: 4.51 MIL/uL (ref 3.87–5.11)
RDW: 13.9 % (ref 11.5–15.5)
WBC: 10 10*3/uL (ref 4.0–10.5)
nRBC: 0 % (ref 0.0–0.2)

## 2019-03-19 LAB — URINALYSIS, ROUTINE W REFLEX MICROSCOPIC
Bilirubin Urine: NEGATIVE
Glucose, UA: NEGATIVE mg/dL
Ketones, ur: NEGATIVE mg/dL
Leukocytes,Ua: NEGATIVE
Nitrite: NEGATIVE
Protein, ur: NEGATIVE mg/dL
Specific Gravity, Urine: 1.018 (ref 1.005–1.030)
pH: 6 (ref 5.0–8.0)

## 2019-03-19 LAB — BASIC METABOLIC PANEL
Anion gap: 7 (ref 5–15)
BUN: 9 mg/dL (ref 6–20)
CO2: 23 mmol/L (ref 22–32)
Calcium: 9.1 mg/dL (ref 8.9–10.3)
Chloride: 107 mmol/L (ref 98–111)
Creatinine, Ser: 0.56 mg/dL (ref 0.44–1.00)
GFR calc Af Amer: >60 mL/min (ref >60)
GFR calc non Af Amer: >60 mL/min (ref >60)
Glucose, Bld: 94 mg/dL (ref 70–99)
Potassium: 3.6 mmol/L (ref 3.5–5.1)
Sodium: 137 mmol/L (ref 135–145)

## 2019-03-19 LAB — I-STAT BETA HCG BLOOD, ED (MC, WL, AP ONLY): I-stat hCG, quantitative: <5 m[IU]/mL (ref <5)

## 2019-03-19 MED ORDER — PREDNISONE 20 MG PO TABS: 20.0000 mg | ORAL_TABLET | Freq: Two times a day (BID) | ORAL | 0 refills | Status: AC

## 2019-03-19 NOTE — ED Provider Notes (Signed)
[facility]-EMERGENCY DEPT Provider Note   CSN: [id number] Arrival date & time: 03/19/19  1409     History   Chief Complaint Chief Complaint  Patient presents with   Flank Pain    HPI Rebecca Figueroa is a 38 y.o. female.     HPI   She presents for evaluation of right flank pain rating to right upper quadrant abdomen, present for 2 months, waxing waning, worse with movement, sleeping, and walking.  No known trauma.  No dysuria, urinary frequency, nausea, vomiting, fever, chills, cough, shortness of breath or chest pain.  No prior similar problems.  There are no other known modifying factors.  Past Medical History:  Diagnosis Date   Abnormal Pap smear    colpo   Allergy    Urinary tract infection     Patient Active Problem List   Diagnosis Date Noted   Annual physical exam 03/20/2017    Past Surgical History:  Procedure Laterality Date   CHOLECYSTECTOMY     KNEE ARTHROSCOPY     TUBAL LIGATION       OB History    Gravida  4   Para  4   Term  4   Preterm  0   AB  0   Living  4     SAB  0   TAB  0   Ectopic  0   Multiple  0   Live Births  4            Home Medications    Prior to Admission medications   Medication Sig Start Date End Date Taking? Authorizing Provider  acetaminophen (TYLENOL) 500 MG tablet Take 1,000 mg by mouth every 6 (six) hours as needed for mild pain.   Yes [provider]  traMADol (ULTRAM) 50 MG tablet Take 1 tablet (50 mg total) by mouth every 6 (six) hours as needed for severe pain. Patient not taking: Reported on 03/20/2017 03/07/17   Marylene Land, CNM    Family History Family History  Problem Relation Age of Onset   Cancer Father        cancer   Kidney disease Paternal Aunt        2 aunts- died from kidney failure   Cancer Maternal Grandfather        stomach   Hypertension Maternal Aunt     Social History Social History   Tobacco Use    Smoking status: Former Smoker   Smokeless tobacco: Never Used  Substance Use Topics   Alcohol use: Yes    Comment: socially   Drug use: No     Allergies   Aspirin, Ibuprofen, Nsaids, Other, and Shellfish allergy   Review of Systems Review of Systems  All other systems reviewed and are negative.    Physical Exam Updated Vital Signs BP 104/66 (BP Location: Left Arm)    Pulse (!) 53    Temp 98.7 F (37.1 C) (Oral)    Resp 18    Ht 5' 7.5" (1.715 m)    Wt 97.5 kg    LMP 02/20/2019    SpO2 100%    BMI 33.18 kg/m   Physical Exam Vitals signs and nursing note reviewed.  Constitutional:      General: She is not in acute distress.    Appearance: She is well-developed. She is not ill-appearing or diaphoretic.  HENT:     Head: Normocephalic and atraumatic.     Right Ear: External ear normal.  Left Ear: External ear normal.  Eyes:     Conjunctiva/sclera: Conjunctivae normal.     Pupils: Pupils are equal, round, and reactive to light.  Neck:     Musculoskeletal: Normal range of motion and neck supple.     Trachea: Phonation normal.  Cardiovascular:     Rate and Rhythm: Normal rate.  Pulmonary:     Effort: Pulmonary effort is normal.  Musculoskeletal: Normal range of motion.        General: No swelling or tenderness.     Comments: Normal ambulation  Skin:    General: Skin is warm and dry.  Neurological:     Mental Status: She is alert and oriented to person, place, and time.     Cranial Nerves: No cranial nerve deficit.     Sensory: No sensory deficit.     Motor: No abnormal muscle tone.     Coordination: Coordination normal.  Psychiatric:        Mood and Affect: Mood normal.        Behavior: Behavior normal.        Thought Content: Thought content normal.        Judgment: Judgment normal.      ED Treatments / Results  Labs (all labs ordered are listed, but only abnormal results are displayed) Labs Reviewed  URINALYSIS, ROUTINE W REFLEX MICROSCOPIC -  Abnormal; Notable for the following components:      Result Value   Hgb urine dipstick SMALL (*)    Bacteria, UA RARE (*)    All other components within normal limits  BASIC METABOLIC PANEL  CBC  I-STAT BETA HCG BLOOD, ED ([facility], [facility], [facility] ONLY)  I-STAT BETA HCG BLOOD, ED ([facility], [facility], [facility] ONLY)    EKG None  Radiology Ct Renal Stone Study  Result Date: 03/19/2019 CLINICAL DATA:  Right-sided flank pain for several days EXAM: CT ABDOMEN AND PELVIS WITHOUT CONTRAST TECHNIQUE: Multidetector CT imaging of the abdomen and pelvis was performed following the standard protocol without IV contrast. COMPARISON:  None. FINDINGS: Lower chest: No acute abnormality. Hepatobiliary: No focal liver abnormality is seen. Status post cholecystectomy. No biliary dilatation. Pancreas: Unremarkable. No pancreatic ductal dilatation or surrounding inflammatory changes. Spleen: Normal in size without focal abnormality. Adrenals/Urinary Tract: Adrenal glands are within normal limits. Kidneys are well visualized bilaterally. No right renal calculi or obstructive changes are seen. The right ureter is within normal limits. The bladder is partially distended. The left kidney demonstrates multiple nonobstructing renal calculi. No obstructive changes are seen. The ureter is within normal limits. Stomach/Bowel: Colon is predominately decompressed. The appendix is within normal limits without inflammatory change. No small bowel abnormality is noted. Vascular/Lymphatic: No significant vascular findings are present. No enlarged abdominal or pelvic lymph nodes. Reproductive: Uterus and bilateral adnexa are unremarkable. Other: No abdominal wall hernia or abnormality. No abdominopelvic ascites. Musculoskeletal: No acute bony abnormality is noted. IMPRESSION: Nonobstructing left renal calculi. The largest of these measures approximately 10 mm. No urinary tract obstructive changes are seen. No other focal abnormality is noted. Electronically Signed    By: Alcide CleverMark  Lukens M.D.   On: 03/19/2019 22:55    Procedures Procedures (including critical care time)  Medications Ordered in ED Medications - No data to display   Initial Impression / Assessment and Plan / ED Course  I have reviewed the triage vital signs and the nursing notes.  Pertinent labs & imaging results that were available during my care of the patient were reviewed by me  and considered in my medical decision making (see chart for details).  Clinical Course as of Mar 18 2299  Wed Mar 19, 2019  2258 Normal  I-Stat beta hCG blood, ED [EW]  2258 Normal  CBC [EW]  2258 Normal  Basic metabolic panel [EW]  1610 Normal except small amount of blood and bacteria present  Urinalysis, Routine w reflex microscopic- may I&O cath if menses(!) [EW]  2259 Per radiologist consistent with left-sided renal calculi, normal right kidney and normal right ureter.  No evidence for right-sided urolithiasis.  CT Renal Laren Everts [EW]    Clinical Course User Index [EW] Daleen Bo, MD        Patient Vitals for the past 24 hrs:  BP Temp Temp src Pulse Resp SpO2 Height Weight  03/19/19 2256 104/66 -- -- (!) 53 18 100 % -- --  03/19/19 2036 108/75 -- -- 62 17 97 % -- --  03/19/19 1718 112/74 98.7 F (37.1 C) Oral 62 16 100 % -- --  03/19/19 1446 -- -- -- -- -- -- 5' 7.5" (1.715 m) 97.5 kg  03/19/19 1442 103/69 98.8 F (37.1 C) Oral (!) 56 16 100 % -- --    11:00 PM Reevaluation with update and discussion. After initial assessment and treatment, an updated evaluation reveals no change in clinical status.  Findings discussed with the patient all questions were answered.Daleen Bo   Medical Decision Making: Right flank pain is nonspecific and most likely musculoskeletal related.  Doubt lumbar myelopathy.  Possible nerve impingement.  Incidental left renal stones, not currently causing problems.  Nonspecific small amount of blood in urine at this time.  No occasion further intervention  ED or hospitalization at this time.  CRITICAL CARE-no Performed by: Daleen Bo Nursing Notes Reviewed/ Care Coordinated Applicable Imaging Reviewed Interpretation of Laboratory Data incorporated into ED treatment  The patient appears reasonably screened and/or stabilized for discharge and I doubt any other medical condition or other [facility] requiring further screening, evaluation, or treatment in the ED at this time prior to discharge.  Plan: Home Medications-OTC analgesia of choice; Home Treatments-heat to affected area; return here if the recommended treatment, does not improve the symptoms; Recommended follow up-PCP and urology follow-up.   Final Clinical Impressions(s) / ED Diagnoses   Final diagnoses:  None    ED Discharge Orders    None       Daleen Bo, MD 03/19/19 2309

## 2019-03-19 NOTE — ED Triage Notes (Signed)
Patient c/o right flank pain and right groin area pain x 2 months. Worse for the past 4-5 days. Patient denies dysuria or urinary frequency.

## 2019-03-19 NOTE — ED Notes (Signed)
Pt was verbalized discharge instructions. Pt had no further questions at this time. NAD, 

## 2019-03-19 NOTE — Discharge Instructions (Signed)
There was no clear cause for the right flank pain.  It may be muscle or nerve related.  We are prescribing prednisone to help your discomfort.  It will also help to use heat on the sore area 3 or 4 times a day.  You do have some stones in your left kidney which do not appear to be causing problems at this time.  You can follow-up with a urologist about them, to see if there is additional treatments they can give for that.

## 2019-04-01 ENCOUNTER — Other Ambulatory Visit: Payer: Self-pay

## 2019-04-01 DIAGNOSIS — Z20822 Contact with and (suspected) exposure to covid-19: Secondary | ICD-10-CM

## 2019-04-03 LAB — NOVEL CORONAVIRUS, NAA: SARS-CoV-2, NAA: DETECTED — AB

## 2020-07-11 ENCOUNTER — Other Ambulatory Visit: Payer: Self-pay

## 2020-07-11 ENCOUNTER — Encounter (HOSPITAL_COMMUNITY): Payer: Self-pay | Admitting: Emergency Medicine

## 2020-07-11 ENCOUNTER — Emergency Department (HOSPITAL_COMMUNITY)
Admission: EM | Admit: 2020-07-11 | Discharge: 2020-07-11 | Disposition: A | Discharge status: Home / Self Care | Payer: Self-pay | Attending: Emergency Medicine | Admitting: Emergency Medicine

## 2020-07-11 DIAGNOSIS — K029 Dental caries, unspecified: Secondary | ICD-10-CM

## 2020-07-11 DIAGNOSIS — K0889 Other specified disorders of teeth and supporting structures: Secondary | ICD-10-CM | POA: Insufficient documentation

## 2020-07-11 DIAGNOSIS — Z87891 Personal history of nicotine dependence: Secondary | ICD-10-CM | POA: Insufficient documentation

## 2020-07-11 MED ORDER — HYDROCODONE-ACETAMINOPHEN 5-325 MG PO TABS: 2.0000 | ORAL_TABLET | ORAL | 0 refills | Status: AC | PRN

## 2020-07-11 MED ORDER — CLINDAMYCIN HCL 300 MG PO CAPS: 300.0000 mg | ORAL_CAPSULE | Freq: Four times a day (QID) | ORAL | 0 refills | Status: DC

## 2020-07-11 NOTE — ED Notes (Signed)
Patient given heat pack for face.

## 2020-07-11 NOTE — ED Triage Notes (Signed)
Patient arrives complaining of left sided swelling and pain. Patient states that she has had dental issues in that past.

## 2020-07-11 NOTE — Discharge Instructions (Addendum)
Begin taking clindamycin as prescribed.  Take hydrocodone as prescribed as needed for pain.  Follow-up with a dentist in the next several days.

## 2020-07-11 NOTE — ED Provider Notes (Signed)
[facility]-EMERGENCY DEPT Provider Note   CSN: [id number] Arrival date & time: 07/11/20  8657     History Chief Complaint  Patient presents with  . Dental Pain    Rebecca Figueroa is a 40 y.o. female.  Patient is a 40 year old female with no significant past medical history.  She presents with complaints of dental pain and swelling.  This is worsened over the past several days.  She describes a broken off tooth the left upper molar.  This is now becoming more swollen and causing her face to swell.  She denies any fevers or chills.  She denies difficulty breathing or swallowing.  The history is provided by the patient.       Past Medical History:  Diagnosis Date  . Abnormal Pap smear    colpo  . Allergy   . Urinary tract infection     Patient Active Problem List   Diagnosis Date Noted  . Annual physical exam 03/20/2017    Past Surgical History:  Procedure Laterality Date  . CHOLECYSTECTOMY    . KNEE ARTHROSCOPY    . TUBAL LIGATION       OB History    Gravida  4   Para  4   Term  4   Preterm  0   AB  0   Living  4     SAB  0   IAB  0   Ectopic  0   Multiple  0   Live Births  4           Family History  Problem Relation Age of Onset  . Cancer Father        cancer  . Kidney disease Paternal Aunt        2 aunts- died from kidney failure  . Cancer Maternal Grandfather        stomach  . Hypertension Maternal Aunt     Social History   Tobacco Use  . Smoking status: Former Games developer  . Smokeless tobacco: Never Used  Vaping Use  . Vaping Use: Never used  Substance Use Topics  . Alcohol use: Yes    Comment: socially  . Drug use: No    Home Medications Prior to Admission medications   Medication Sig Start Date End Date Taking? Authorizing Provider  acetaminophen (TYLENOL) 500 MG tablet Take 1,000 mg by mouth every 6 (six) hours as needed for mild pain.    [provider]  predniSONE (DELTASONE) 20 MG  tablet Take 1 tablet (20 mg total) by mouth 2 (two) times daily. 03/19/19   Mancel Bale, MD  traMADol (ULTRAM) 50 MG tablet Take 1 tablet (50 mg total) by mouth every 6 (six) hours as needed for severe pain. Patient not taking: Reported on 03/20/2017 03/07/17   Marylene Land, CNM    Allergies    Aspirin, Ibuprofen, Nsaids, Other, and Shellfish allergy  Review of Systems   Review of Systems  All other systems reviewed and are negative.   Physical Exam Updated Vital Signs BP (!) 144/90 (BP Location: Right Arm)   Pulse 73   Temp 99 F (37.2 C) (Oral)   Resp 20   Ht 5\' 7"  (1.702 m)   Wt 101.6 kg   SpO2 95%   BMI 35.08 kg/m   Physical Exam Vitals and nursing note reviewed.  Constitutional:      General: She is not in acute distress.    Appearance: Normal appearance. She is not ill-appearing,  toxic-appearing or diaphoretic.  HENT:     Head: Normocephalic and atraumatic.     Mouth/Throat:     Mouth: Mucous membranes are moist.     Pharynx: No oropharyngeal exudate.     Comments: Patient with somewhat poor dentition.  She has a left upper second molar which is mostly missing.  There is surrounding gingival inflammation, but no frank abscess. Pulmonary:     Effort: Pulmonary effort is normal.  Skin:    General: Skin is warm and dry.  Neurological:     Mental Status: She is alert and oriented to person, place, and time.     ED Results / Procedures / Treatments   Labs (all labs ordered are listed, but only abnormal results are displayed) Labs Reviewed - No data to display  EKG None  Radiology No results found.  Procedures Procedures   Medications Ordered in ED Medications - No data to display  ED Course  I have reviewed the triage vital signs and the nursing notes.  Pertinent labs & imaging results that were available during my care of the patient were reviewed by me and considered in my medical decision making (see chart for details).    MDM  Rules/Calculators/A&P  Patient with dental pain.  She has a decayed tooth that is broken off at the gumline.  There is surrounding gingival inflammation.  This will be treated with clindamycin and pain medicine.  She is to follow-up with dentistry.  Final Clinical Impression(s) / ED Diagnoses Final diagnoses:  None    Rx / DC Orders ED Discharge Orders    None       Geoffery Lyons, MD 07/11/20 [phone]

## 2020-07-19 ENCOUNTER — Emergency Department (HOSPITAL_COMMUNITY)
Admission: EM | Admit: 2020-07-19 | Discharge: 2020-07-19 | Disposition: A | Discharge status: Home / Self Care | Payer: Self-pay | Attending: Emergency Medicine | Admitting: Emergency Medicine

## 2020-07-19 ENCOUNTER — Encounter (HOSPITAL_COMMUNITY): Payer: Self-pay | Admitting: Emergency Medicine

## 2020-07-19 DIAGNOSIS — K0889 Other specified disorders of teeth and supporting structures: Secondary | ICD-10-CM | POA: Insufficient documentation

## 2020-07-19 DIAGNOSIS — Z87891 Personal history of nicotine dependence: Secondary | ICD-10-CM | POA: Insufficient documentation

## 2020-07-19 DIAGNOSIS — R220 Localized swelling, mass and lump, head: Secondary | ICD-10-CM | POA: Insufficient documentation

## 2020-07-19 NOTE — ED Triage Notes (Signed)
Patient is complaining of mouth abscess. Patient got a prescription for pain meds and antibiotics. Patient picked up antibiotics but was not able to get pain medication due to shortage. Patient tried to get them again and they stated that they were out of stock. Patient went to another pharmacy and could not get them transferred. Patient wants another prescription for pain medication. She says her mouth still hurts.

## 2020-07-19 NOTE — Discharge Instructions (Signed)
Take tylenol 1000mg (2 extra strength) four times a day.   Return for fever, inability to swallow.   You need to follow-up with a dentist.  They are the only ones that can fix this problem.  I have given you information for the oral surgeon that is on-call this morning.  Please give them a call and see if they can arrange to see you.

## 2020-07-19 NOTE — ED Provider Notes (Signed)
[facility]-EMERGENCY DEPT Provider Note   CSN: [id number] Arrival date & time: 07/19/20  0123     History Chief Complaint  Patient presents with  . Dental Problem    Rebecca Figueroa is a 40 y.o. female.  40 yo F with a chief complaint of left upper dental pain.  Is been going on for about a week.  She had pain with this off and on for some time.  Thought to completely improved but had worsened over the past week or so.  Had a fever at some point last week.  Was seen in the ED about a week ago and started on clindamycin.  Was also prescribed a narcotic but unfortunately the pharmacy was out of stock and she has not yet been able to get it filled.  She is only able to take Tylenol because she has a allergy to NSAIDs.  The history is provided by the patient.  Illness Severity:  Moderate Onset quality:  Gradual Duration:  1 week Timing:  Constant Progression:  Worsening Chronicity:  New Associated symptoms: no chest pain, no congestion, no fever, no headaches, no myalgias, no nausea, no rhinorrhea, no shortness of breath, no vomiting and no wheezing        Past Medical History:  Diagnosis Date  . Abnormal Pap smear    colpo  . Allergy   . Urinary tract infection     Patient Active Problem List   Diagnosis Date Noted  . Annual physical exam 03/20/2017    Past Surgical History:  Procedure Laterality Date  . CHOLECYSTECTOMY    . KNEE ARTHROSCOPY    . TUBAL LIGATION       OB History    Gravida  4   Para  4   Term  4   Preterm  0   AB  0   Living  4     SAB  0   IAB  0   Ectopic  0   Multiple  0   Live Births  4           Family History  Problem Relation Age of Onset  . Cancer Father        cancer  . Kidney disease Paternal Aunt        2 aunts- died from kidney failure  . Cancer Maternal Grandfather        stomach  . Hypertension Maternal Aunt     Social History   Tobacco Use  . Smoking status: Former Games developer   . Smokeless tobacco: Never Used  Vaping Use  . Vaping Use: Never used  Substance Use Topics  . Alcohol use: Yes    Comment: socially  . Drug use: No    Home Medications Prior to Admission medications   Medication Sig Start Date End Date Taking? Authorizing Provider  acetaminophen (TYLENOL) 500 MG tablet Take 1,000 mg by mouth every 6 (six) hours as needed for mild pain.    [provider]  clindamycin (CLEOCIN) 300 MG capsule Take 1 capsule (300 mg total) by mouth 4 (four) times daily. X 7 days 07/11/20   Geoffery Lyons, MD  HYDROcodone-acetaminophen (NORCO) 5-325 MG tablet Take 2 tablets by mouth every 4 (four) hours as needed. 07/11/20   Geoffery Lyons, MD  predniSONE (DELTASONE) 20 MG tablet Take 1 tablet (20 mg total) by mouth 2 (two) times daily. 03/19/19   Mancel Bale, MD  traMADol (ULTRAM) 50 MG tablet Take 1 tablet (50  mg total) by mouth every 6 (six) hours as needed for severe pain. Patient not taking: Reported on 03/20/2017 03/07/17   Marylene Land, CNM    Allergies    Aspirin, Ibuprofen, Nsaids, Other, and Shellfish allergy  Review of Systems   Review of Systems  Constitutional: Negative for chills and fever.  HENT: Positive for dental problem. Negative for congestion and rhinorrhea.   Eyes: Negative for redness and visual disturbance.  Respiratory: Negative for shortness of breath and wheezing.   Cardiovascular: Negative for chest pain and palpitations.  Gastrointestinal: Negative for nausea and vomiting.  Genitourinary: Negative for dysuria and urgency.  Musculoskeletal: Negative for arthralgias and myalgias.  Skin: Negative for pallor and wound.  Neurological: Negative for dizziness and headaches.    Physical Exam Updated Vital Signs BP (!) 127/98 (BP Location: Right Arm)   Pulse (!) 105   Temp 97.7 F (36.5 C) (Oral)   Resp 16   Ht 5' 7.5" (1.715 m)   Wt 99.8 kg   LMP 07/04/2020   SpO2 100%   BMI 33.95 kg/m   Physical Exam Vitals  and nursing note reviewed.  Constitutional:      General: She is not in acute distress.    Appearance: She is well-developed. She is not diaphoretic.  HENT:     Head: Normocephalic and atraumatic.     Mouth/Throat:     Comments: Fractured tooth at the gumline at the first molar on the left upper side of the mouth.  There is some very mild focal swelling, no fluctuance no drainage.  No facial swelling.  Tolerating secretions without difficulty. Eyes:     Pupils: Pupils are equal, round, and reactive to light.  Cardiovascular:     Rate and Rhythm: Normal rate and regular rhythm.     Heart sounds: No murmur heard. No friction rub. No gallop.   Pulmonary:     Effort: Pulmonary effort is normal.     Breath sounds: No wheezing or rales.  Abdominal:     General: There is no distension.     Palpations: Abdomen is soft.     Tenderness: There is no abdominal tenderness.  Musculoskeletal:        General: No tenderness.     Cervical back: Normal range of motion and neck supple.  Skin:    General: Skin is warm and dry.  Neurological:     Mental Status: She is alert and oriented to person, place, and time.  Psychiatric:        Behavior: Behavior normal.     ED Results / Procedures / Treatments   Labs (all labs ordered are listed, but only abnormal results are displayed) Labs Reviewed - No data to display  EKG None  Radiology No results found.  Procedures Procedures   Medications Ordered in ED Medications - No data to display  ED Course  I have reviewed the triage vital signs and the nursing notes.  Pertinent labs & imaging results that were available during my care of the patient were reviewed by me and considered in my medical decision making (see chart for details).    MDM Rules/Calculators/A&P                          40 yo F with a chief complaint of left upper dental pain.  Going on for the past couple weeks.  Was seen in the ED about a week ago and was given  a list  of dental clinics however the patient has not attempted to follow-up.  She tells me that she is waiting for her insurance to come through.  I discussed with her at length that the fix for her dental problems as a dentist.  She was prescribed narcotics at her prior visit but has had trouble getting them filled.  I discussed with her that it is our department policy that we do not prescribe narcotics for dental pain.  We will have her take Tylenol as needed.  Given information for the oral surgeon that is on-call this morning.  2:13 AM:  I have discussed the diagnosis/risks/treatment options with the patient and believe the pt to be eligible for discharge home to follow-up with Dentist. We also discussed returning to the ED immediately if new or worsening sx occur. We discussed the sx which are most concerning (e.g., sudden worsening pain, fever, inability to tolerate by mouth) that necessitate immediate return. Medications administered to the patient during their visit and any new prescriptions provided to the patient are listed below.  Medications given during this visit Medications - No data to display   The patient appears reasonably screen and/or stabilized for discharge and I doubt any other medical condition or other [facility] requiring further screening, evaluation, or treatment in the ED at this time prior to discharge.   Final Clinical Impression(s) / ED Diagnoses Final diagnoses:  Pain, dental    Rx / DC Orders ED Discharge Orders    None       Melene Plan, DO 07/19/20 9741

## 2021-04-12 ENCOUNTER — Encounter (HOSPITAL_COMMUNITY): Payer: Self-pay

## 2021-04-12 ENCOUNTER — Emergency Department (HOSPITAL_COMMUNITY)
Admission: EM | Admit: 2021-04-12 | Discharge: 2021-04-12 | Disposition: A | Discharge status: Home / Self Care | Payer: 59 | Attending: Emergency Medicine | Admitting: Emergency Medicine

## 2021-04-12 ENCOUNTER — Other Ambulatory Visit: Payer: Self-pay

## 2021-04-12 DIAGNOSIS — N6012 Diffuse cystic mastopathy of left breast: Secondary | ICD-10-CM

## 2021-04-12 DIAGNOSIS — Z87891 Personal history of nicotine dependence: Secondary | ICD-10-CM | POA: Insufficient documentation

## 2021-04-12 DIAGNOSIS — M25512 Pain in left shoulder: Secondary | ICD-10-CM | POA: Diagnosis present

## 2021-04-12 DIAGNOSIS — N6320 Unspecified lump in the left breast, unspecified quadrant: Secondary | ICD-10-CM | POA: Diagnosis not present

## 2021-04-12 MED ORDER — LIDOCAINE 5 % EX PTCH: 1.0000 | MEDICATED_PATCH | CUTANEOUS | 1 refills | Status: AC

## 2021-04-12 MED ORDER — CYCLOBENZAPRINE HCL 5 MG PO TABS: 5.0000 mg | ORAL_TABLET | Freq: Three times a day (TID) | ORAL | 1 refills | Status: AC | PRN

## 2021-04-12 NOTE — ED Triage Notes (Signed)
Pt reports left sided shoulder pain that worsens with movement over the past week or so. Pt also reports lump to left breast for a few weeks.

## 2021-04-12 NOTE — Discharge Instructions (Signed)
You were seen in the emergency department for left shoulder pain and left breast mass.  Your exam findings were reassuring today.  We believe that the lump that you feel in your breast is related to fibrocystic changes, however since you have not had a mammogram in the past 3-4 years, you will need to follow-up with your PCP to get this scheduled.  For your shoulder pain, I have prescribed you a medication called cyclobenzaprine.  Please take this 3 times a day as needed for your pain.  I have also given you a lidocaine patch for your pain, which you can place in the area that you are having the most pain.  Please return to the ED with any severe, persistent numbness/tingling, bloody discharge from the breast, or new skin changes of the breast.

## 2021-04-12 NOTE — ED Provider Notes (Signed)
[facility]-EMERGENCY DEPT Provider Note   CSN: [id number] Arrival date & time: 04/12/21  1049    History Chief Complaint  Patient presents with   Breast Pain   Shoulder Pain    Rebecca Figueroa is a 40 y.o. female who presents to the ED today with complaints of left breast lump and left shoulder pain x 1 week.   The patient first noticed her left shoulder pain 1 week ago. No hx of trauma. It is exacerbated by overhead movements.  She also has intermittent numbness and tingling in the lateral aspect of the shoulder and upper arm.  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  She describes the pain as "soreness."  And when the pain occurs, it is mostly the posterior aspect of the shoulder.  She has tried Tylenol for the pain which helps intermittently.  She says that her activities are not limited currently because she tries to work through the pain when she has it.  In terms of the left breast lump, she noticed this about a week ago.  She said that the lump "feels different than the other breast."  She has not noticed any discharge from the breast or any overlying skin changes.  She has not had this happen to her in the past.  No family history of breast cancer.  Patient drinks alcohol socially.  She used to smoke 1 pack biweekly when she was younger but no longer smokes cigarettes.  Her last menstrual cycle was about 2 weeks ago.  She states that her periods are regular.  She has not had a mammogram in the past 3-4 years.     Past Medical History:  Diagnosis Date   Abnormal Pap smear    colpo   Allergy    Urinary tract infection     Patient Active Problem List   Diagnosis Date Noted   Annual physical exam 03/20/2017    Past Surgical History:  Procedure Laterality Date   CHOLECYSTECTOMY     KNEE ARTHROSCOPY     TUBAL LIGATION       OB History     Gravida  4   Para  4   Term  4   Preterm  0   AB  0   Living  4      SAB   0   IAB  0   Ectopic  0   Multiple  0   Live Births  4           Family History  Problem Relation Age of Onset   Cancer Father        cancer   Kidney disease Paternal Aunt        2 aunts- died from kidney failure   Cancer Maternal Grandfather        stomach   Hypertension Maternal Aunt     Social History   Tobacco Use   Smoking status: Former   Smokeless tobacco: Never  Building services engineer Use: Never used  Substance Use Topics   Alcohol use: Yes    Comment: socially   Drug use: No    Home Medications Prior to Admission medications   Medication Sig Start Date End Date Taking? Authorizing Provider  cyclobenzaprine (FLEXERIL) 5 MG tablet Take 1 tablet (5 mg total) by mouth 3 (three) times daily as needed for muscle spasms. 04/12/21  Yes Andrey Campanile, MD  lidocaine (LIDODERM) 5 %  Place 1 patch onto the skin daily. Remove & Discard patch within 12 hours or as directed by MD 04/12/21  Yes Andrey Campanile, MD  acetaminophen (TYLENOL) 500 MG tablet Take 1,000 mg by mouth every 6 (six) hours as needed for mild pain.    [provider]  clindamycin (CLEOCIN) 300 MG capsule Take 1 capsule (300 mg total) by mouth 4 (four) times daily. X 7 days 07/11/20   Geoffery Lyons, MD  HYDROcodone-acetaminophen (NORCO) 5-325 MG tablet Take 2 tablets by mouth every 4 (four) hours as needed. 07/11/20   Geoffery Lyons, MD  predniSONE (DELTASONE) 20 MG tablet Take 1 tablet (20 mg total) by mouth 2 (two) times daily. 03/19/19   Mancel Bale, MD  traMADol (ULTRAM) 50 MG tablet Take 1 tablet (50 mg total) by mouth every 6 (six) hours as needed for severe pain. Patient not taking: Reported on 03/20/2017 03/07/17   Marylene Land, CNM    Allergies    Aspirin, Ibuprofen, Nsaids, Other, and Shellfish allergy  Review of Systems   Review of Systems  Constitutional: Negative.   HENT: Negative.    Eyes: Negative.   Respiratory: Negative.    Cardiovascular: Negative.    Gastrointestinal: Negative.   Endocrine: Negative.   Genitourinary:        Left breast lump  Musculoskeletal:        Shoulder pain, left  Skin: Negative.   Allergic/Immunologic: Negative.   Neurological:  Positive for numbness.  Hematological: Negative.   Psychiatric/Behavioral: Negative.     Physical Exam Updated Vital Signs BP 107/68   Pulse (!) 57   Temp 98.9 F (37.2 C) (Oral)   Resp 16   SpO2 100%   Physical Exam Exam conducted with a chaperone present.  Constitutional:      General: She is not in acute distress.    Appearance: Normal appearance.  HENT:     Head: Normocephalic and atraumatic.  Eyes:     Extraocular Movements: Extraocular movements intact.     Pupils: Pupils are equal, round, and reactive to light.  Cardiovascular:     Rate and Rhythm: Normal rate and regular rhythm.     Heart sounds: No murmur heard.   No friction rub. No gallop.  Pulmonary:     Effort: Pulmonary effort is normal.     Breath sounds: Normal breath sounds. No wheezing, rhonchi or rales.  Chest:     Chest wall: No mass or deformity.  Breasts:    Right: No skin change.     Left: No skin change.     Comments: Performed in the presence of a chaperone.  A small, round, mobile lump is felt in the left breast most consistent with fibrocystic breast changes.  No nipple discharge or skin changes. Abdominal:     General: Abdomen is flat. There is no distension.     Tenderness: There is no abdominal tenderness.  Musculoskeletal:     Comments: Left shoulder: Abduction is limited in the setting of pain.  Able to abduct to 100 degrees.  Negative empty can test.  Normal sensation.  Tenderness palpation at the posterior aspect of the shoulder. Right shoulder: Normal exam  Lymphadenopathy:     Upper Body:     Right upper body: No supraclavicular or axillary adenopathy.     Left upper body: No supraclavicular or axillary adenopathy.  Neurological:     Mental Status: She is alert.    ED  Results / Procedures / Treatments  Labs (all labs ordered are listed, but only abnormal results are displayed) Labs Reviewed - No data to display  EKG None  Radiology No results found.  Procedures Procedures   Medications Ordered in ED Medications - No data to display  ED Course  I have reviewed the triage vital signs and the nursing notes.  Pertinent labs & imaging results that were available during my care of the patient were reviewed by me and considered in my medical decision making (see chart for details).    MDM Rules/Calculators/A&P                          This is a 40 year old female with no past medical history who is here today for left shoulder pain and a left breast lump.  Exam findings of left shoulder show limited abduction in the setting of pain with some TTP at the posterior aspect of the left shoulder but it otherwise unremarkable, and the pt is NVI.  Left shoulder pain likely represents tendinopathy and does not warrant further imaging at this time.  We will manage conservatively with cyclobenzaprine as needed and a lidocaine patch.  Left breast mass on exam is small, round, mobile, with no other abnormalities most consistent with fibrocystic changes.  Because the patient has not had a mammogram in last 3-4 years, we advised her to follow-up with her PCP to get this scheduled.  Patient was discharged home in stable condition.   Final Clinical Impression(s) / ED Diagnoses Final diagnoses:  Fibrocystic changes of left breast  Acute pain of left shoulder    Rx / DC Orders ED Discharge Orders          Ordered    cyclobenzaprine (FLEXERIL) 5 MG tablet  3 times daily PRN        04/12/21 1319    lidocaine (LIDODERM) 5 %  Every 24 hours        04/12/21 1319             Andrey Campanile, MD 04/12/21 1350    Lorre Nick, MD 04/14/21 1121

## 2021-04-12 NOTE — ED Provider Notes (Signed)
I saw and evaluated the patient, reviewed the resident's note and I agree with the findings and plan.      40 year old female presents with musculoskeletal left-sided shoulder pain.  Also had some lumps in her left breast.  On exam she has reducible tenderness at her left posterior shoulder.  On her left breast exam, she has no nipple drainage or discharge.  Likely fibrocystic disease.  Informed to follow-up with her doctor for a mammogram   Lorre Nick, MD 04/12/21 1318

## 2022-03-18 ENCOUNTER — Encounter (HOSPITAL_COMMUNITY): Payer: Self-pay

## 2022-03-18 ENCOUNTER — Ambulatory Visit (HOSPITAL_COMMUNITY)
Admission: EM | Admit: 2022-03-18 | Discharge: 2022-03-18 | Disposition: A | Discharge status: Home / Self Care | Payer: 59 | Attending: Emergency Medicine | Admitting: Emergency Medicine

## 2022-03-18 DIAGNOSIS — K0889 Other specified disorders of teeth and supporting structures: Secondary | ICD-10-CM

## 2022-03-18 MED ORDER — TRAMADOL HCL 50 MG PO TABS: 50.0000 mg | ORAL_TABLET | Freq: Four times a day (QID) | ORAL | 0 refills | Status: AC | PRN

## 2022-03-18 MED ORDER — CLINDAMYCIN HCL 300 MG PO CAPS: 300.0000 mg | ORAL_CAPSULE | Freq: Four times a day (QID) | ORAL | 0 refills | Status: DC

## 2022-03-18 NOTE — ED Triage Notes (Signed)
Pt presents to the office for dental pain and pressure.

## 2022-03-18 NOTE — ED Provider Notes (Signed)
[facility]    CSN: [id number] Arrival date & time: 03/18/22  1333      History   Chief Complaint Chief Complaint  Patient presents with   Dental Pain    HPI Rebecca Figueroa is a 41 y.o. female.   Patient presents with dental pain to the left upper gumline for 2 to 3 days.  Endorses a broken tooth is present at the site of concern.  Has associated facial swelling that is radiating into the left eye.  Has been attempting use of Tylenol which has been ineffective.  Denies drainage, fever or chills.   Past Medical History:  Diagnosis Date   Abnormal Pap smear    colpo   Allergy    Urinary tract infection     Patient Active Problem List   Diagnosis Date Noted   Annual physical exam 03/20/2017    Past Surgical History:  Procedure Laterality Date   CHOLECYSTECTOMY     KNEE ARTHROSCOPY     TUBAL LIGATION      OB History     Gravida  4   Para  4   Term  4   Preterm  0   AB  0   Living  4      SAB  0   IAB  0   Ectopic  0   Multiple  0   Live Births  4            Home Medications    Prior to Admission medications   Medication Sig Start Date End Date Taking? Authorizing Provider  acetaminophen (TYLENOL) 500 MG tablet Take 1,000 mg by mouth every 6 (six) hours as needed for mild pain.    [provider]  clindamycin (CLEOCIN) 300 MG capsule Take 1 capsule (300 mg total) by mouth 4 (four) times daily. X 7 days 07/11/20   Geoffery Lyons, MD  cyclobenzaprine (FLEXERIL) 5 MG tablet Take 1 tablet (5 mg total) by mouth 3 (three) times daily as needed for muscle spasms. 04/12/21   Andrey Campanile, MD  HYDROcodone-acetaminophen (NORCO) 5-325 MG tablet Take 2 tablets by mouth every 4 (four) hours as needed. 07/11/20   Geoffery Lyons, MD  lidocaine (LIDODERM) 5 % Place 1 patch onto the skin daily. Remove & Discard patch within 12 hours or as directed by MD 04/12/21   Andrey Campanile, MD  predniSONE (DELTASONE) 20 MG tablet Take 1  tablet (20 mg total) by mouth 2 (two) times daily. 03/19/19   Mancel Bale, MD  traMADol (ULTRAM) 50 MG tablet Take 1 tablet (50 mg total) by mouth every 6 (six) hours as needed for severe pain. Patient not taking: Reported on 03/20/2017 03/07/17   Marylene Land, CNM    Family History Family History  Problem Relation Age of Onset   Cancer Father        cancer   Kidney disease Paternal Aunt        2 aunts- died from kidney failure   Cancer Maternal Grandfather        stomach   Hypertension Maternal Aunt     Social History Social History   Tobacco Use   Smoking status: Former   Smokeless tobacco: Never  Building services engineer Use: Never used  Substance Use Topics   Alcohol use: Yes    Comment: socially   Drug use: No     Allergies   Aspirin, Ibuprofen, Nsaids, Other, and Shellfish allergy   Review  of Systems Review of Systems  Constitutional: Negative.   HENT:  Positive for dental problem. Negative for congestion, drooling, ear discharge, ear pain, facial swelling, hearing loss, mouth sores, nosebleeds, postnasal drip, rhinorrhea, sinus pressure, sinus pain, sneezing, sore throat, tinnitus, trouble swallowing and voice change.   Respiratory: Negative.    Cardiovascular: Negative.      Physical Exam Triage Vital Signs ED Triage Vitals [03/18/22 1422]  Enc Vitals Group     BP 136/86     Pulse Rate 68     Resp 16     Temp 98.5 F (36.9 C)     Temp Source Oral     SpO2 98 %     Weight      Height      Head Circumference      Peak Flow      Pain Score      Pain Loc      Pain Edu?      Excl. in GC?    No data found.  Updated Vital Signs BP 136/86 (BP Location: Right Arm)   Pulse 68   Temp 98.5 F (36.9 C) (Oral)   Resp 16   LMP 03/15/2022   SpO2 98%   Visual Acuity Right Eye Distance:   Left Eye Distance:   Bilateral Distance:    Right Eye Near:   Left Eye Near:    Bilateral Near:     Physical Exam Constitutional:       Appearance: Normal appearance.  HENT:     Head: Normocephalic.     Mouth/Throat:      Comments: Broken tooth with decay present to the left upper gumline with moderate gingival swelling, no abscess noted, mild swelling to the left cheek without abscess, pharynx is clear without obstruction Eyes:     Extraocular Movements: Extraocular movements intact.  Pulmonary:     Effort: Pulmonary effort is normal.  Neurological:     Mental Status: She is alert and oriented to person, place, and time.      UC Treatments / Results  Labs (all labs ordered are listed, but only abnormal results are displayed) Labs Reviewed - No data to display  EKG   Radiology No results found.  Procedures Procedures (including critical care time)  Medications Ordered in UC Medications - No data to display  Initial Impression / Assessment and Plan / UC Course  I have reviewed the triage vital signs and the nursing notes.  Pertinent labs & imaging results that were available during my care of the patient were reviewed by me and considered in my medical decision making (see chart for details).  Dental pain  We will prophylactically provide bacterial coverage, clindamycin prescribed as patient endorses success with this medicine in the past, prescribed tramadol in addition, 10 tablets to be dispensed, low risk, PDMP reviewed, advised use of over-the-counter analgesics initially before attempting stronger medications, recommended good dental hygiene and patient endorses that she has a dentist to reach out to and will call Monday morning for an appointment, may follow-up with urgent care as needed, work note given Final Clinical Impressions(s) / UC Diagnoses   Final diagnoses:  None   Discharge Instructions   None    ED Prescriptions   None    PDMP not reviewed this encounter.   Valinda Hoar, NP 03/18/22 1456

## 2022-03-18 NOTE — Discharge Instructions (Signed)
Today you are being treated for dental pain  Take clindamycin every 6 hours for the next 7 days, this will provide coverage for any germs that may be aiding to your symptoms  You may take ibuprofen 600 mg every 6-8 hours and/or Tylenol 500 to 1000 mg every 6 hours for pain, for severe pain use tramadol every 6 hours as needed, use sparingly as you will only be dispensed 10 tablets  May attempt salt water gargles, throat lozenges, warm liquids and soft foods for additional comfort  Please reach out to dentist on Monday to get an appointment as this is the only person who can truly help relieve your symptoms

## 2022-05-16 ENCOUNTER — Emergency Department (HOSPITAL_COMMUNITY)
Admission: EM | Admit: 2022-05-16 | Discharge: 2022-05-16 | Disposition: A | Discharge status: Home / Self Care | Payer: 59 | Source: Home / Self Care

## 2023-01-22 ENCOUNTER — Ambulatory Visit (HOSPITAL_COMMUNITY)
Admission: EM | Admit: 2023-01-22 | Discharge: 2023-01-22 | Disposition: A | Discharge status: Home / Self Care | Payer: 59 | Attending: Family Medicine | Admitting: Family Medicine

## 2023-01-22 ENCOUNTER — Encounter (HOSPITAL_COMMUNITY): Payer: Self-pay | Admitting: *Deleted

## 2023-01-22 ENCOUNTER — Other Ambulatory Visit: Payer: Self-pay

## 2023-01-22 DIAGNOSIS — K047 Periapical abscess without sinus: Secondary | ICD-10-CM

## 2023-01-22 MED ORDER — AMOXICILLIN-POT CLAVULANATE 875-125 MG PO TABS: 1.0000 | ORAL_TABLET | Freq: Two times a day (BID) | ORAL | 0 refills | Status: DC

## 2023-01-22 NOTE — Discharge Instructions (Addendum)
Please take your antibiotics 1 pill twice a day for the next 7 days.  Please follow-up if you do not get any better.  Please come see [place] immediately if you feel like you are developing any reaction to the antibiotics.

## 2023-01-22 NOTE — ED Provider Notes (Signed)
[facility]    CSN: [id number] Arrival date & time: 01/22/23  0813      History   Chief Complaint Chief Complaint  Patient presents with   Dental Problem    HPI Rebecca Figueroa is a 42 y.o. female.   Patient is presenting with 3-day history of left upper gum pain.  Patient states that she has had issues with her left upper gums in the past and at that time required antibiotics.  Patient states that she has a tooth that needs to come out but has not been able to get it done.  Patient notes that she has had some chills as well but cannot relay if she has had any fevers.  Patient has no diarrhea or any other concerns.  Patient states that the pain has worsened over the past 3 days and the Tylenol is no longer helping the pain.    Past Medical History:  Diagnosis Date   Abnormal Pap smear    colpo   Allergy    Urinary tract infection     Patient Active Problem List   Diagnosis Date Noted   Annual physical exam 03/20/2017    Past Surgical History:  Procedure Laterality Date   CHOLECYSTECTOMY     KNEE ARTHROSCOPY     TUBAL LIGATION      OB History     Gravida  4   Para  4   Term  4   Preterm  0   AB  0   Living  4      SAB  0   IAB  0   Ectopic  0   Multiple  0   Live Births  4            Home Medications    Prior to Admission medications   Medication Sig Start Date End Date Taking? Authorizing Provider  acetaminophen (TYLENOL) 500 MG tablet Take 1,000 mg by mouth every 6 (six) hours as needed for mild pain.   Yes [provider]  amoxicillin-clavulanate (AUGMENTIN) 875-125 MG tablet Take 1 tablet by mouth every 12 (twelve) hours. 01/22/23  Yes Brenton Grills, MD  cyclobenzaprine (FLEXERIL) 5 MG tablet Take 1 tablet (5 mg total) by mouth 3 (three) times daily as needed for muscle spasms. 04/12/21   Andrey Campanile, MD  HYDROcodone-acetaminophen (NORCO) 5-325 MG tablet Take 2 tablets by mouth every 4 (four) hours as  needed. 07/11/20   Geoffery Lyons, MD  lidocaine (LIDODERM) 5 % Place 1 patch onto the skin daily. Remove & Discard patch within 12 hours or as directed by MD 04/12/21   Andrey Campanile, MD  predniSONE (DELTASONE) 20 MG tablet Take 1 tablet (20 mg total) by mouth 2 (two) times daily. 03/19/19   Mancel Bale, MD  traMADol (ULTRAM) 50 MG tablet Take 1 tablet (50 mg total) by mouth every 6 (six) hours as needed. 03/18/22   Valinda Hoar, NP    Family History Family History  Problem Relation Age of Onset   Cancer Father        cancer   Kidney disease Paternal Aunt        2 aunts- died from kidney failure   Cancer Maternal Grandfather        stomach   Hypertension Maternal Aunt     Social History Social History   Tobacco Use   Smoking status: Former   Smokeless tobacco: Never  Vaping Use   Vaping status: Never Used  Substance Use Topics   Alcohol use: Yes    Comment: socially   Drug use: No     Allergies   Aspirin, Ibuprofen, Nsaids, Other, and Shellfish allergy   Review of Systems Review of Systems  Constitutional:  Negative for chills and fever.  HENT:  Positive for dental problem. Negative for ear pain and sore throat.   Eyes:  Negative for pain and visual disturbance.  Respiratory:  Negative for cough and shortness of breath.   Cardiovascular:  Negative for chest pain and palpitations.  Gastrointestinal:  Negative for abdominal pain and vomiting.  Genitourinary:  Negative for dysuria and hematuria.  Musculoskeletal:  Negative for arthralgias and back pain.  Skin:  Negative for color change and rash.  Neurological:  Negative for seizures and syncope.  All other systems reviewed and are negative.    Physical Exam Triage Vital Signs ED Triage Vitals  Encounter Vitals Group     BP 01/22/23 0840 110/77     Systolic BP Percentile --      Diastolic BP Percentile --      Pulse Rate 01/22/23 0840 64     Resp 01/22/23 0840 18     Temp 01/22/23 0840 99 F (37.2  C)     Temp src --      SpO2 01/22/23 0840 96 %     Weight --      Height --      Head Circumference --      Peak Flow --      Pain Score 01/22/23 0837 8     Pain Loc --      Pain Education --      Exclude from Growth Chart --    No data found.  Updated Vital Signs BP 110/77   Pulse 64   Temp 99 F (37.2 C)   Resp 18   LMP 01/08/2023 (Approximate)   SpO2 96%   Visual Acuity Right Eye Distance:   Left Eye Distance:   Bilateral Distance:    Right Eye Near:   Left Eye Near:    Bilateral Near:     Physical Exam Constitutional:      Appearance: Normal appearance.  HENT:     Head: Normocephalic and atraumatic.     Mouth/Throat:     Mouth: Mucous membranes are moist.     Pharynx: Oropharynx is clear. Posterior oropharyngeal erythema present. No oropharyngeal exudate.     Comments: There is some swelling and erythema of the upper gums on the left side.  There is tenderness to palpation.  There is notable poor dental hygiene of the affected area. Neurological:     Mental Status: She is alert.      UC Treatments / Results  Labs (all labs ordered are listed, but only abnormal results are displayed) Labs Reviewed - No data to display  EKG   Radiology No results found.  Procedures Procedures (including critical care time)  Medications Ordered in UC Medications - No data to display  Initial Impression / Assessment and Plan / UC Course  I have reviewed the triage vital signs and the nursing notes.  Pertinent labs & imaging results that were available during my care of the patient were reviewed by me and considered in my medical decision making (see chart for details).     Patient symptoms are likely related to a dental infection.  Patient would benefit from following up with dentist to discuss removing tubes which may be causing repeat  issues.  At this time we will do Augmentin 1 pill twice a day for the next 7 days.  Patient advised to follow-up if no  improvement.  Patient is understanding and agreeable with plan. Final Clinical Impressions(s) / UC Diagnoses   likely related Final diagnoses:  Dental infection     Discharge Instructions      Please take your antibiotics 1 pill twice a day for the next 7 days.  Please follow-up if you do not get any better.  Please come see [place] immediately if you feel like you are developing any reaction to the antibiotics.     ED Prescriptions     Medication Sig Dispense Auth. Provider   amoxicillin-clavulanate (AUGMENTIN) 875-125 MG tablet Take 1 tablet by mouth every 12 (twelve) hours. 14 tablet Brenton Grills, MD      PDMP not reviewed this encounter.   Brenton Grills, MD 01/22/23 [phone]

## 2023-01-22 NOTE — ED Triage Notes (Signed)
Pt reports broken tooth on Lt side . Pt took a whole bottle of tylenol yesterday. Pt wants a anti-bx.

## 2023-06-07 ENCOUNTER — Ambulatory Visit
Admission: EM | Admit: 2023-06-07 | Discharge: 2023-06-07 | Disposition: A | Discharge status: Home / Self Care | Payer: Self-pay | Attending: Family Medicine | Admitting: Family Medicine

## 2023-06-07 DIAGNOSIS — K029 Dental caries, unspecified: Secondary | ICD-10-CM

## 2023-06-07 DIAGNOSIS — Z5971 Insufficient health insurance coverage: Secondary | ICD-10-CM

## 2023-06-07 DIAGNOSIS — K047 Periapical abscess without sinus: Secondary | ICD-10-CM

## 2023-06-07 MED ORDER — AMOXICILLIN-POT CLAVULANATE 875-125 MG PO TABS: 1.0000 | ORAL_TABLET | Freq: Two times a day (BID) | ORAL | 0 refills | Status: AC

## 2023-06-07 NOTE — ED Triage Notes (Signed)
Left side of face swollen due to dental problem.

## 2023-06-07 NOTE — Discharge Instructions (Addendum)
At this time there is no abscess to be drained. You were prescribed an antibiotic. Please take this exactly as directed and do not stop taking it until the entire course of medicine is finished, even if you begin to feel better before finishing the course.  Schedule an appointment with your dentist or call local dentists to see if they take your insurance or can do a payment payment plan.  [facility] [employer] [facility] are options. Consider [facility] prior to your dental appointment.   Consider the [facility] dental school  free clinic operates from 6-9 pm on select Wednesdays:  [facility] [employer], [place] [address] [place], [place] [address] Parking Location: [place]unker

## 2023-06-07 NOTE — ED Provider Notes (Signed)
[facility]    CSN: [id number] Arrival date & time: 06/07/23  8657      History   Chief Complaint Chief Complaint  Patient presents with   Abscess   Dental Pain    HPI Mertis Scurlock is a 43 y.o. female.   HPI  Trenae presents for left upper dental pain that started yesterday.  She doesn't go to a dentist regularly.  Had pain with eating. She says she has an abscess.  She has facial swelling when she woke up this morning. Has been taking "a lot" of Tylenol as she can't take NSAIDs.       Past Medical History:  Diagnosis Date   Abnormal Pap smear    colpo   Allergy    Urinary tract infection     Patient Active Problem List   Diagnosis Date Noted   Annual physical exam 03/20/2017    Past Surgical History:  Procedure Laterality Date   CHOLECYSTECTOMY     KNEE ARTHROSCOPY     TUBAL LIGATION      OB History     Gravida  4   Para  4   Term  4   Preterm  0   AB  0   Living  4      SAB  0   IAB  0   Ectopic  0   Multiple  0   Live Births  4            Home Medications    Prior to Admission medications   Medication Sig Start Date End Date Taking? Authorizing Provider  amoxicillin-clavulanate (AUGMENTIN) 875-125 MG tablet Take 1 tablet by mouth every 12 (twelve) hours. 06/07/23  Yes Bobby Ragan, Seward Meth, DO  acetaminophen (TYLENOL) 500 MG tablet Take 1,000 mg by mouth every 6 (six) hours as needed for mild pain.    [provider]  cyclobenzaprine (FLEXERIL) 5 MG tablet Take 1 tablet (5 mg total) by mouth 3 (three) times daily as needed for muscle spasms. 04/12/21   Andrey Campanile, MD  HYDROcodone-acetaminophen (NORCO) 5-325 MG tablet Take 2 tablets by mouth every 4 (four) hours as needed. 07/11/20   Geoffery Lyons, MD  lidocaine (LIDODERM) 5 % Place 1 patch onto the skin daily. Remove & Discard patch within 12 hours or as directed by MD 04/12/21   Andrey Campanile, MD  predniSONE (DELTASONE) 20 MG tablet Take 1  tablet (20 mg total) by mouth 2 (two) times daily. 03/19/19   Mancel Bale, MD  traMADol (ULTRAM) 50 MG tablet Take 1 tablet (50 mg total) by mouth every 6 (six) hours as needed. 03/18/22   Valinda Hoar, NP    Family History Family History  Problem Relation Age of Onset   Cancer Father        cancer   Kidney disease Paternal Aunt        2 aunts- died from kidney failure   Cancer Maternal Grandfather        stomach   Hypertension Maternal Aunt     Social History Social History   Tobacco Use   Smoking status: Former   Smokeless tobacco: Never  Advertising account planner   Vaping status: Never Used  Substance Use Topics   Alcohol use: Yes    Comment: socially   Drug use: No     Allergies   Aspirin, Ibuprofen, Nsaids, Other, and Shellfish allergy   Review of Systems Review of Systems : negative unless otherwise stated  in HPI.      Physical Exam Triage Vital Signs ED Triage Vitals  Encounter Vitals Group     BP 06/07/23 0859 106/78     Systolic BP Percentile --      Diastolic BP Percentile --      Pulse Rate 06/07/23 0859 67     Resp 06/07/23 0859 17     Temp 06/07/23 0859 98.3 F (36.8 C)     Temp Source 06/07/23 0859 Oral     SpO2 06/07/23 0859 100 %     Weight --      Height --      Head Circumference --      Peak Flow --      Pain Score 06/07/23 0858 10     Pain Loc --      Pain Education --      Exclude from Growth Chart --    No data found.  Updated Vital Signs BP 106/78 (BP Location: Left Arm)   Pulse 67   Temp 98.3 F (36.8 C) (Oral)   Resp 17   SpO2 100%   Visual Acuity Right Eye Distance:   Left Eye Distance:   Bilateral Distance:    Right Eye Near:   Left Eye Near:    Bilateral Near:     Physical Exam  GEN:     alert, uncomfortable appearing female in no distress    HENT:  mucus membranes moist, oropharyngeal without lesions exudates or erythema, nasal discharge, posterior left 1st upper left molar tender to percussion with gum swelling  that is TTP, no trismus, no secretion pooling,  no visible swelling of the floor the mouth; normal jaw movement without difficulty, facial edema on the left  EYES:   no scleral injection or discharge NECK:  normal ROM, no lymphadenopathy, no meningismus   RESP:  no increased work of breathing Skin:   warm and dry    UC Treatments / Results  Labs (all labs ordered are listed, but only abnormal results are displayed) Labs Reviewed - No data to display  EKG   Radiology No results found.  Procedures Procedures (including critical care time)  Medications Ordered in UC Medications - No data to display  Initial Impression / Assessment and Plan / UC Course  I have reviewed the triage vital signs and the nursing notes.  Pertinent labs & imaging results that were available during my care of the patient were reviewed by me and considered in my medical decision making (see chart for details).     Pt is a 43 y.o. female who presents for 1-2 days of dental pain. Koraline is afebrile here without recent antipyretics. Satting well on room air. Overall pt is uncomfortabel appearing, well hydrated, without respiratory distress.  Dental exam concerning for dental abscess. Treat with Augmentin.  - continue Tylenol  as needed for discomfort - Gargle with salt water several times a day - Dental resource handout provided  - Establish care with a dentist.  Given information for [employer] and [facility] who has been known to provide care plan and late  visits - Discussed  ED precautions, understanding voiced.   Discussed MDM, treatment plan and plan for follow-up with patient who agrees with plan.   Final Clinical Impressions(s) / UC Diagnoses   Final diagnoses:  Dental abscess  Does not have health insurance  Pain due to dental caries     Discharge Instructions  At this time there is no abscess to be drained. You were prescribed an antibiotic. Please take this  exactly as directed and do not stop taking it until the entire course of medicine is finished, even if you begin to feel better before finishing the course.  Schedule an appointment with your dentist or call local dentists to see if they take your insurance or can do a payment payment plan.  [facility] [employer] [facility] are options. Consider [facility] prior to your dental appointment.   Consider the [facility] dental school  free clinic operates from 6-9 pm on select Wednesdays:  [facility] Ground Floor, [place] [address] [place], [place] [address] Parking Location: [place]unker           ED Prescriptions     Medication Sig Dispense Auth. Provider   amoxicillin-clavulanate (AUGMENTIN) 875-125 MG tablet Take 1 tablet by mouth every 12 (twelve) hours. 14 tablet Adiya Selmer, Seward Meth, DO      PDMP not reviewed this encounter.   Katha Cabal, DO 06/07/23 [phone]

## 2023-06-20 ENCOUNTER — Other Ambulatory Visit: Payer: Self-pay | Admitting: Pediatrics

## 2023-06-20 ENCOUNTER — Encounter: Payer: Self-pay | Admitting: Nurse Practitioner

## 2023-06-20 DIAGNOSIS — Z1231 Encounter for screening mammogram for malignant neoplasm of breast: Secondary | ICD-10-CM

## 2023-07-13 ENCOUNTER — Inpatient Hospital Stay: Admission: RE | Admit: 2023-07-13 | Payer: Self-pay | Source: Ambulatory Visit

## 2023-07-25 ENCOUNTER — Ambulatory Visit
Admission: RE | Admit: 2023-07-25 | Discharge: 2023-07-25 | Disposition: A | Discharge status: Home / Self Care | Source: Ambulatory Visit | Attending: Pediatrics | Admitting: Pediatrics

## 2023-07-25 DIAGNOSIS — Z1231 Encounter for screening mammogram for malignant neoplasm of breast: Secondary | ICD-10-CM

## 2024-04-16 ENCOUNTER — Encounter: Payer: Self-pay | Admitting: Emergency Medicine

## 2024-04-16 ENCOUNTER — Ambulatory Visit
Admission: EM | Admit: 2024-04-16 | Discharge: 2024-04-16 | Disposition: A | Discharge status: Home / Self Care | Attending: Emergency Medicine | Admitting: Emergency Medicine

## 2024-04-16 DIAGNOSIS — K0889 Other specified disorders of teeth and supporting structures: Secondary | ICD-10-CM

## 2024-04-16 MED ORDER — AMOXICILLIN 500 MG PO CAPS: 500.0000 mg | ORAL_CAPSULE | Freq: Three times a day (TID) | ORAL | 0 refills | Status: AC

## 2024-04-16 MED ORDER — ACETAMINOPHEN-CODEINE 300-30 MG PO TABS: 1.0000 | ORAL_TABLET | Freq: Four times a day (QID) | ORAL | 0 refills | Status: AC | PRN

## 2024-04-16 NOTE — ED Provider Notes (Signed)
 [facility]    CSN: [id number] Arrival date & time: 04/16/24  1302      History   Chief Complaint Chief Complaint  Patient presents with   Dental Pain    HPI Rebecca Figueroa is a 43 y.o. female.   Patient presents for evaluation of left upper gumline and dental pain present for 6 days.  Endorses that she was eating and felt something wedged into an already broken tooth which caused irritation.  No need for dental work.  Area feels swollen and she has felt feverish.  Denies presence of drainage.  Tolerable to food and liquid as she has been chewing on the right side.  Has attempted use of Tylenol . \   Past Medical History:  Diagnosis Date   Abnormal Pap smear    colpo   Allergy    Urinary tract infection     Patient Active Problem List   Diagnosis Date Noted   Annual physical exam 03/20/2017    Past Surgical History:  Procedure Laterality Date   CHOLECYSTECTOMY     KNEE ARTHROSCOPY     TUBAL LIGATION      OB History     Gravida  4   Para  4   Term  4   Preterm  0   AB  0   Living  4      SAB  0   IAB  0   Ectopic  0   Multiple  0   Live Births  4            Home Medications    Prior to Admission medications   Medication Sig Start Date End Date Taking? Authorizing Provider  acetaminophen  (TYLENOL ) 500 MG tablet Take 1,000 mg by mouth every 6 (six) hours as needed for mild pain.    [provider]  amoxicillin -clavulanate (AUGMENTIN ) 875-125 MG tablet Take 1 tablet by mouth every 12 (twelve) hours. 06/07/23   Brimage, Vondra, DO  cyclobenzaprine  (FLEXERIL ) 5 MG tablet Take 1 tablet (5 mg total) by mouth 3 (three) times daily as needed for muscle spasms. 04/12/21   Terance Levada BRAVO, MD  HYDROcodone -acetaminophen  (NORCO) 5-325 MG tablet Take 2 tablets by mouth every 4 (four) hours as needed. 07/11/20   Geroldine Berg, MD  lidocaine  (LIDODERM ) 5 % Place 1 patch onto the skin daily. Remove & Discard patch within 12 hours  or as directed by MD 04/12/21   Terance Levada BRAVO, MD  predniSONE  (DELTASONE ) 20 MG tablet Take 1 tablet (20 mg total) by mouth 2 (two) times daily. 03/19/19   Lorriane Holmes, MD  traMADol  (ULTRAM ) 50 MG tablet Take 1 tablet (50 mg total) by mouth every 6 (six) hours as needed. 03/18/22   Teresa Shelba SAUNDERS, NP    Family History Family History  Problem Relation Age of Onset   Cancer Father        cancer   Kidney disease Paternal Aunt        2 aunts- died from kidney failure   Cancer Maternal Grandfather        stomach   Hypertension Maternal Aunt     Social History Social History   Tobacco Use   Smoking status: Former   Smokeless tobacco: Never  Advertising Account Planner   Vaping status: Never Used  Substance Use Topics   Alcohol use: Yes    Comment: socially   Drug use: No     Allergies   Aspirin, Ibuprofen, Nsaids, Other, and Shellfish  allergy   Review of Systems Review of Systems   Physical Exam Triage Vital Signs ED Triage Vitals  Encounter Vitals Group     BP 04/16/24 1315 117/65     Girls Systolic BP Percentile --      Girls Diastolic BP Percentile --      Boys Systolic BP Percentile --      Boys Diastolic BP Percentile --      Pulse Rate 04/16/24 1315 63     Resp 04/16/24 1315 18     Temp 04/16/24 1315 98.4 F (36.9 C)     Temp Source 04/16/24 1315 Oral     SpO2 04/16/24 1315 99 %     Weight --      Height --      Head Circumference --      Peak Flow --      Pain Score 04/16/24 1311 5     Pain Loc --      Pain Education --      Exclude from Growth Chart --    No data found.  Updated Vital Signs BP 117/65 (BP Location: Left Arm)   Pulse 63   Temp 98.4 F (36.9 C) (Oral)   Resp 18   LMP 04/07/2024 (Exact Date)   SpO2 99%   Visual Acuity Right Eye Distance:   Left Eye Distance:   Bilateral Distance:    Right Eye Near:   Left Eye Near:    Bilateral Near:     Physical Exam Constitutional:      Appearance: Normal appearance.  HENT:      Mouth/Throat:     Comments: Dental decay along the left upper gumline with mild gingival swelling and erythema, no abscess noted, pharynx clear without obstruction Eyes:     Extraocular Movements: Extraocular movements intact.  Pulmonary:     Effort: Pulmonary effort is normal.  Neurological:     Mental Status: She is alert and oriented to person, place, and time.      UC Treatments / Results  Labs (all labs ordered are listed, but only abnormal results are displayed) Labs Reviewed - No data to display  EKG   Radiology No results found.  Procedures Procedures (including critical care time)  Medications Ordered in UC Medications - No data to display  Initial Impression / Assessment and Plan / UC Course  I have reviewed the triage vital signs and the nursing notes.  Pertinent labs & imaging results that were available during my care of the patient were reviewed by me and considered in my medical decision making (see chart for details).  Dental pain  Presentation concerning for infection, prescribed amoxicillin , for management of pain prescribed Tylenol  3, PDMP reviewed low risk, recommended nonpharmacological supportive care and strongly advised dental follow-up Final Clinical Impressions(s) / UC Diagnoses   Final diagnoses:  None   Discharge Instructions   None    ED Prescriptions   None    PDMP not reviewed this encounter.   Teresa Shelba SAUNDERS, NP 04/16/24 [phone]

## 2024-04-16 NOTE — ED Triage Notes (Signed)
 Patient reports left upper gum pain x 6 days. Patient took Tylenol  for pain with mild relief. Rates pain 5/10.

## 2024-04-16 NOTE — Discharge Instructions (Signed)
 Today you are evaluated for your dental pain  Take amoxicillin  every 8 hours for 7 days for treatment  May take Tylenol  as needed for pain, for severe pain you may use Tylenol  3 which has codeine, may take every 6 hours as needed please be mindful this can make you feel sleepy  Increase your fluid intake and to you are able to eat as normal to maintain your hydration  May attempt salt water gargles throat lozenges warm liquids soft foods and warm compresses to the cheek for comfort  Please attempt a follow-up with dentist for further management
